# Patient Record
Sex: Female | Born: 1935
Health system: Southern US, Community
[De-identification: ages and names within clinical notes are randomized; demographics above are authoritative.]

## PROBLEM LIST (undated history)

## (undated) DIAGNOSIS — M810 Age-related osteoporosis without current pathological fracture: Secondary | ICD-10-CM

## (undated) DIAGNOSIS — G459 Transient cerebral ischemic attack, unspecified: Secondary | ICD-10-CM

## (undated) DIAGNOSIS — I10 Essential (primary) hypertension: Secondary | ICD-10-CM

## (undated) DIAGNOSIS — N390 Urinary tract infection, site not specified: Secondary | ICD-10-CM

## (undated) DIAGNOSIS — E785 Hyperlipidemia, unspecified: Secondary | ICD-10-CM

## (undated) DIAGNOSIS — G500 Trigeminal neuralgia: Secondary | ICD-10-CM

## (undated) HISTORY — DX: Age-related osteoporosis without current pathological fracture: M81.0

## (undated) HISTORY — DX: Urinary tract infection, site not specified: N39.0

## (undated) HISTORY — PX: TUBAL LIGATION: SHX77

## (undated) HISTORY — DX: Hyperlipidemia, unspecified: E78.5

## (undated) HISTORY — DX: Trigeminal neuralgia: G50.0

## (undated) HISTORY — DX: Essential (primary) hypertension: I10

## (undated) HISTORY — DX: Transient cerebral ischemic attack, unspecified: G45.9

---

## 2004-12-06 ENCOUNTER — Ambulatory Visit (HOSPITAL_COMMUNITY): Admission: RE | Admit: 2004-12-06 | Discharge: 2004-12-06 | Payer: Self-pay | Admitting: Family Medicine

## 2004-12-29 ENCOUNTER — Ambulatory Visit (HOSPITAL_COMMUNITY): Admission: RE | Admit: 2004-12-29 | Discharge: 2004-12-29 | Payer: Self-pay | Admitting: Gastroenterology

## 2009-07-22 ENCOUNTER — Inpatient Hospital Stay (HOSPITAL_COMMUNITY): Admission: EM | Admit: 2009-07-22 | Discharge: 2009-07-23 | Payer: Self-pay | Admitting: Emergency Medicine

## 2009-07-23 ENCOUNTER — Encounter (INDEPENDENT_AMBULATORY_CARE_PROVIDER_SITE_OTHER): Payer: Self-pay | Admitting: Emergency Medicine

## 2009-07-23 ENCOUNTER — Ambulatory Visit: Payer: Self-pay | Admitting: Vascular Surgery

## 2010-07-26 LAB — URINALYSIS, ROUTINE W REFLEX MICROSCOPIC
Bilirubin Urine: NEGATIVE
Hgb urine dipstick: NEGATIVE
Ketones, ur: NEGATIVE mg/dL
pH: 6 (ref 5.0–8.0)

## 2010-07-26 LAB — DIFFERENTIAL
Eosinophils Absolute: 0.2 10*3/uL (ref 0.0–0.7)
Eosinophils Relative: 3 % (ref 0–5)
Monocytes Absolute: 0.4 10*3/uL (ref 0.1–1.0)
Monocytes Relative: 5 % (ref 3–12)
Neutro Abs: 4.7 10*3/uL (ref 1.7–7.7)

## 2010-07-26 LAB — GLUCOSE, CAPILLARY: Glucose-Capillary: 119 mg/dL — ABNORMAL HIGH (ref 70–99)

## 2010-07-26 LAB — POCT CARDIAC MARKERS: CKMB, poc: 1.1 ng/mL (ref 1.0–8.0)

## 2010-07-26 LAB — BASIC METABOLIC PANEL
BUN: 13 mg/dL (ref 6–23)
CO2: 28 mEq/L (ref 19–32)
Chloride: 104 mEq/L (ref 96–112)
GFR calc non Af Amer: 59 mL/min — ABNORMAL LOW (ref >60)
Glucose, Bld: 68 mg/dL — ABNORMAL LOW (ref 70–99)
Potassium: 4 mEq/L (ref 3.5–5.1)

## 2010-07-26 LAB — POCT I-STAT, CHEM 8
Calcium, Ion: 1.19 mmol/L (ref 1.12–1.32)
Glucose, Bld: 131 mg/dL — ABNORMAL HIGH (ref 70–99)
HCT: 50 % — ABNORMAL HIGH (ref 36.0–46.0)
Hemoglobin: 17 g/dL — ABNORMAL HIGH (ref 12.0–15.0)
Sodium: 136 mEq/L (ref 135–145)

## 2010-07-26 LAB — CARDIAC PANEL(CRET KIN+CKTOT+MB+TROPI)
CK, MB: 1 ng/mL (ref 0.3–4.0)
CK, MB: 1.7 ng/mL (ref 0.3–4.0)
Relative Index: 0.4 (ref 0.0–2.5)
Total CK: 173 U/L (ref 7–177)

## 2010-07-26 LAB — CK TOTAL AND CKMB (NOT AT ARMC): CK, MB: 1.8 ng/mL (ref 0.3–4.0)

## 2010-07-26 LAB — CBC
HCT: 45.8 % (ref 36.0–46.0)
Platelets: 357 10*3/uL (ref 150–400)
WBC: 8 10*3/uL (ref 4.0–10.5)

## 2010-09-17 NOTE — Op Note (Signed)
[name], Valerie Lopez            ACCOUNT NO.:  [id number]   MEDICAL RECORD NO.:  [id number]          PATIENT TYPE:  AMB   LOCATION:  ENDO                         FACILITY:  [facility]   PHYSICIAN:  John C. Madilyn Fireman, M.D.    DATE OF BIRTH:  10-02-1934   DATE OF PROCEDURE:  12/29/2004  DATE OF DISCHARGE:                                 OPERATIVE REPORT   PROCEDURE:  Esophagogastroduodenoscopy with esophageal dilatation.   INDICATIONS FOR PROCEDURE:  Dysphagia primarily from pill, barium swallow  showing cervical web at C5-6, obstructing the barium tablet.   DESCRIPTION OF PROCEDURE:  The patient was placed in the left lateral  decubitus position and placed on the pulse monitor with continuous low-flow  oxygen delivered by nasal cannula. She was sedated with 75 mcg IV fentanyl  and 7.5 mg IV Versed. The Olympus video endoscope was advanced under direct  vision into the oropharynx and esophagus. The esophagus was straight and of  normal caliber with the squamocolumnar line at 38 cm.  I could not clearly  ____________ cervical web, although careful visualization at about 20 cm did  reveal some friability after the scope had been passed, but there was no  perceived resistance to passage of the scope through this area. I do not see  any definite mucosal lesions. The stomach was entered and a small amount of  liquid secretions suctioned from the fundus. Retroflexed view of the cardia  was unremarkable. The fundus, body and pylorus all appeared normal. The  duodenum was entered and both the bulb and second portion were well  inspected and appeared to be within normal limits. A Savary guidewire was  placed through the endoscope channel and scope withdrawn. Savary dilators of  12.8 and 14 mm were passed with moderate resistance on the second dilator  and some blood seen on withdrawal. The second dilator was removed together  with the wire and the patient returned to the recovery room in stable  condition. She tolerated the procedure well and there were no immediate  complications.   IMPRESSION:  Presumed cervical ring based on symptoms and barium swallow  status post dilatation to 14 mm.   PLAN:  Advance diet and observe response to dilatation. If symptoms  persistent may need repeat dilatation with a larger diameter dilator.           ______________________________  Everardo All. Madilyn Fireman, M.D.     [facility]MED[facility]  D:  12/29/2004  T:  12/29/2004  Job:  [id number]   cc:   Holley Bouche, M.D.  [address]  [place], [place] [address]  Fax: [phone]

## 2011-06-28 ENCOUNTER — Other Ambulatory Visit: Payer: Self-pay | Admitting: Gastroenterology

## 2011-06-29 ENCOUNTER — Other Ambulatory Visit: Payer: Self-pay

## 2011-07-05 ENCOUNTER — Ambulatory Visit
Admission: RE | Admit: 2011-07-05 | Discharge: 2011-07-05 | Disposition: A | Discharge status: Home / Self Care | Payer: Medicare HMO | Source: Ambulatory Visit | Attending: Gastroenterology | Admitting: Gastroenterology

## 2011-08-14 ENCOUNTER — Ambulatory Visit (INDEPENDENT_AMBULATORY_CARE_PROVIDER_SITE_OTHER): Payer: Medicare HMO | Admitting: Family Medicine

## 2011-08-14 ENCOUNTER — Ambulatory Visit: Payer: Medicare HMO

## 2011-08-14 VITALS — BP 162/75 | HR 71 | Temp 98.2°F | Resp 16 | Ht 64.0 in | Wt 130.0 lb

## 2011-08-14 DIAGNOSIS — R519 Headache, unspecified: Secondary | ICD-10-CM

## 2011-08-14 DIAGNOSIS — R51 Headache: Secondary | ICD-10-CM

## 2011-08-14 DIAGNOSIS — G501 Atypical facial pain: Secondary | ICD-10-CM

## 2011-08-14 LAB — POCT CBC
HCT, POC: 44 % (ref 37.7–47.9)
Hemoglobin: 14.5 g/dL (ref 12.2–16.2)
Lymph, poc: 3.1 (ref 0.6–3.4)
MCV: 89.7 fL (ref 80–97)
MID (cbc): 0.7 (ref 0–0.9)
POC Granulocyte: 5 (ref 2–6.9)
POC LYMPH PERCENT: 35.7 %L (ref 10–50)
POC MID %: 7.9 %M (ref 0–12)
RDW, POC: 14.6 %
WBC: 8.8 10*3/uL (ref 4.6–10.2)

## 2011-08-14 MED ORDER — ACETAMINOPHEN-CODEINE #3 300-30 MG PO TABS: 1.0000 | ORAL_TABLET | Freq: Three times a day (TID) | ORAL | Status: AC | PRN

## 2011-08-14 MED ORDER — GABAPENTIN 100 MG PO CAPS: ORAL_CAPSULE | ORAL | Status: DC

## 2011-08-14 NOTE — Progress Notes (Signed)
  Subjective:    Patient ID: Valerie Lopez, female    DOB: 08/14/1934, 77 y.o.   MRN: [id number]  HPI  Complains of (L) facial and dental pain intermittent over the last 9-12 months Feels tender "root or gum" and radiates over (L) facial region No pain with mastication (L) side of nose stuffy and watery (L) eye  Pain can be quite intense; typically lasting 30 minutes however yesterday lasted 3 hours  Dental visit last fall panorex normal Primary MD - Dr. Cliffton Asters   Review of Systems     Objective:   Physical Exam  Constitutional: She is oriented to person, place, and time. She appears well-developed.  HENT:  Right Ear: Hearing and tympanic membrane normal.  Left Ear: Hearing and tympanic membrane normal.  Nose: Nose normal.  Mouth/Throat: No dental abscesses or dental caries.    Cardiovascular: Normal rate, regular rhythm and normal heart sounds.   Pulmonary/Chest: Effort normal and breath sounds normal.  Neurological: She is alert and oriented to person, place, and time. She has normal reflexes. No cranial nerve deficit or sensory deficit. She exhibits normal muscle tone.  Skin: Skin is warm.      [facility] reading (PRIMARY) by  Dr. Hal Hope Normal sinus series  Results for orders placed in visit on 08/14/11  POCT CBC      Component Value Range   WBC 8.8  4.6 - 10.2 (K/uL)   Lymph, poc 3.1  0.6 - 3.4    POC LYMPH PERCENT 35.7  10 - 50 (%L)   MID (cbc) 0.7  0 - 0.9    POC MID % 7.9  0 - 12 (%M)   POC Granulocyte 5.0  2 - 6.9    Granulocyte percent 56.4  37 - 80 (%G)   RBC 4.90  4.04 - 5.48 (M/uL)   Hemoglobin 14.5  12.2 - 16.2 (g/dL)   HCT, POC 44.0  10.2 - 47.9 (%)   MCV 89.7  80 - 97 (fL)   MCH, POC 29.6  27 - 31.2 (pg)   MCHC 33.0  31.8 - 35.4 (g/dL)   RDW, POC 72.5     Platelet Count, POC 534 (*) 142 - 424 (K/uL)   MPV 9.0  0 - 99.8 (fL)         Assessment & Plan:   1. Trigeminal neuralgia  POCT CBC, DG Sinuses Complete, gabapentin (NEURONTIN) 100 MG  capsule, acetaminophen-codeine (TYLENOL #3) 300-30 MG per tablet   Patient to schedule follow up with her primary MD

## 2011-08-17 ENCOUNTER — Telehealth: Payer: Self-pay

## 2011-08-17 NOTE — Telephone Encounter (Signed)
Pt wants to know if it okay NOT to increase her medications to two a day.  She has taken one tablet and does not feel she needs to increase the dosage per Dr. Lenord Fellers instruction. Please call  Okay to leave message on machine  [phone]

## 2011-08-18 NOTE — Telephone Encounter (Signed)
Patient diagnosed with trigeminal neuralgia which is a very painful condition.  If patient's symptoms are controlled by taking 1 tablet daily patient does not need to titrate up on dosage.

## 2011-08-18 NOTE — Telephone Encounter (Signed)
Please advise on this.  

## 2014-08-28 ENCOUNTER — Telehealth: Payer: Self-pay | Admitting: Internal Medicine

## 2014-08-28 NOTE — Telephone Encounter (Signed)
new patient referral-s/w patient and gave np appt for 05/25 @ 10:30 w/Dr. Julien Nordmann Referring dr. Harlan Stains Dx- Thrombocytosis

## 2014-09-24 ENCOUNTER — Ambulatory Visit (HOSPITAL_BASED_OUTPATIENT_CLINIC_OR_DEPARTMENT_OTHER): Payer: Medicare HMO | Admitting: Internal Medicine

## 2014-09-24 ENCOUNTER — Ambulatory Visit: Payer: Medicare HMO

## 2014-09-24 ENCOUNTER — Encounter: Payer: Self-pay | Admitting: Internal Medicine

## 2014-09-24 ENCOUNTER — Ambulatory Visit (HOSPITAL_BASED_OUTPATIENT_CLINIC_OR_DEPARTMENT_OTHER): Payer: Medicare HMO

## 2014-09-24 ENCOUNTER — Other Ambulatory Visit (HOSPITAL_BASED_OUTPATIENT_CLINIC_OR_DEPARTMENT_OTHER): Payer: Medicare HMO

## 2014-09-24 ENCOUNTER — Telehealth: Payer: Self-pay | Admitting: Internal Medicine

## 2014-09-24 ENCOUNTER — Other Ambulatory Visit: Payer: Self-pay | Admitting: Internal Medicine

## 2014-09-24 VITALS — BP 160/63 | HR 70 | Temp 98.3°F | Resp 18 | Ht 64.0 in | Wt 131.4 lb

## 2014-09-24 DIAGNOSIS — D75839 Thrombocytosis, unspecified: Secondary | ICD-10-CM

## 2014-09-24 DIAGNOSIS — D473 Essential (hemorrhagic) thrombocythemia: Secondary | ICD-10-CM

## 2014-09-24 LAB — COMPREHENSIVE METABOLIC PANEL (CC13)
ALBUMIN: 3.6 g/dL (ref 3.5–5.0)
ALT: 9 U/L (ref 0–55)
AST: 20 U/L (ref 5–34)
Alkaline Phosphatase: 68 U/L (ref 40–150)
Anion Gap: 10 mEq/L (ref 3–11)
BILIRUBIN TOTAL: 0.46 mg/dL (ref 0.20–1.20)
BUN: 11.9 mg/dL (ref 7.0–26.0)
CO2: 29 mEq/L (ref 22–29)
CREATININE: 0.9 mg/dL (ref 0.6–1.1)
Calcium: 8.8 mg/dL (ref 8.4–10.4)
Chloride: 100 mEq/L (ref 98–109)
EGFR: 61 mL/min/{1.73_m2} — ABNORMAL LOW (ref >90)
Glucose: 77 mg/dl (ref 70–140)
Potassium: 3.9 mEq/L (ref 3.5–5.1)
Sodium: 138 mEq/L (ref 136–145)
TOTAL PROTEIN: 7.1 g/dL (ref 6.4–8.3)

## 2014-09-24 LAB — CBC WITH DIFFERENTIAL/PLATELET
BASO%: 2.4 % — AB (ref 0.0–2.0)
BASOS ABS: 0.2 10*3/uL — AB (ref 0.0–0.1)
EOS%: 3.5 % (ref 0.0–7.0)
Eosinophils Absolute: 0.2 10*3/uL (ref 0.0–0.5)
HCT: 43.4 % (ref 34.8–46.6)
HEMOGLOBIN: 14.2 g/dL (ref 11.6–15.9)
LYMPH%: 28 % (ref 14.0–49.7)
MCH: 29.7 pg (ref 25.1–34.0)
MCHC: 32.8 g/dL (ref 31.5–36.0)
MCV: 90.6 fL (ref 79.5–101.0)
MONO#: 0.5 10*3/uL (ref 0.1–0.9)
MONO%: 7.7 % (ref 0.0–14.0)
NEUT#: 4.1 10*3/uL (ref 1.5–6.5)
NEUT%: 58.4 % (ref 38.4–76.8)
Platelets: 531 10*3/uL — ABNORMAL HIGH (ref 145–400)
RBC: 4.79 10*6/uL (ref 3.70–5.45)
RDW: 14.3 % (ref 11.2–14.5)
WBC: 7 10*3/uL (ref 3.9–10.3)
lymph#: 2 10*3/uL (ref 0.9–3.3)

## 2014-09-24 LAB — FERRITIN CHCC: Ferritin: 95 ng/ml (ref 9–269)

## 2014-09-24 LAB — IRON AND TIBC CHCC
%SAT: 37 % (ref 21–57)
IRON: 116 ug/dL (ref 41–142)
TIBC: 310 ug/dL (ref 236–444)
UIBC: 194 ug/dL (ref 120–384)

## 2014-09-24 LAB — LACTATE DEHYDROGENASE (CC13): LDH: 202 U/L (ref 125–245)

## 2014-09-24 NOTE — Progress Notes (Signed)
[facility] [phone]   [phone]  CONSULT NOTE  REFERRING PHYSICIAN:Dr. Harlan Stains  REASON FOR CONSULTATION:  80 years old white female with elevated platelets count  HPI Valerie Lopez is a 80 y.o. female with past medical history significant for hypertension, dyslipidemia, TIA as well as osteoporosis. The patient was seen recently by her primary care physician Dr. Dema Severin and during routine blood work on 06/26/2014 she was noted to have elevated platelets count of 522,000. The patient was referred to me for evaluation and recommendation regarding this abnormality. The patient otherwise feeling fine with no specific issues. She has no personal or family history ofblood disease. She denied having any bleeding, bruises or ecchymosis. The patient denied having any rheumatologic disorders or recent viral infection. She denied having any history of anemia and she is not currently on any iron or vitamin supplements. Review of systems today showed that the patient has no complaints except for around 10 pounds of weight loss recently. She has no night sweats. She denied having any significant fever or chills, no nausea or vomiting. The patient denied having any significant chest pain, shortness of breath, cough or hemoptysis. Family history significant for mother with heart disease. Father died from old age and she had 3 brothers with different type of cancer including 2 with throat cancer and one with lung cancer. The patient is a widow and has 6 children. She is currently retired and used to do clerical work. She has no history of smoking, alcohol or drug abuse. HPI  Past Medical History  Diagnosis Date  . Hypertension   . UTI (lower urinary tract infection)   . Dyslipidemia   . TIA (transient ischemic attack)   . Trigeminal neuralgia   . Osteoporosis     History reviewed. No pertinent past surgical history.  Family History  Problem Relation  Age of Onset  . Heart disease Mother   . Cancer Brother     Social History History  Substance Use Topics  . Smoking status: Never Smoker   . Smokeless tobacco: Never Used  . Alcohol Use: No    Allergies  Allergen Reactions  . Zocor [Simvastatin] Other (See Comments)    Elevated liver enzymes    Current Outpatient Prescriptions  Medication Sig Dispense Refill  . aspirin 81 MG tablet Take 81 mg by mouth daily.    . cholecalciferol (VITAMIN D) 1000 UNITS tablet Take 2,000 Units by mouth daily.    Marland Kitchen ezetimibe (ZETIA) 10 MG tablet Take 10 mg by mouth daily.    Marland Kitchen lisinopril-hydrochlorothiazide (PRINZIDE,ZESTORETIC) 10-12.5 MG per tablet Take 1 tablet by mouth daily.    . Multiple Vitamin (MULTIVITAMIN) capsule Take 1 capsule by mouth daily.     No current facility-administered medications for this visit.    Review of Systems  Constitutional: negative Eyes: negative Ears, nose, mouth, throat, and face: negative Respiratory: negative Cardiovascular: negative Gastrointestinal: negative Genitourinary:negative Integument/breast: negative Hematologic/lymphatic: negative Musculoskeletal:negative Neurological: negative Behavioral/Psych: negative Endocrine: negative Allergic/Immunologic: negative  Physical Exam  QVZ:DGLOV, healthy, no distress, well nourished and well developed SKIN: skin color, texture, turgor are normal, no rashes or significant lesions HEAD: Normocephalic, No masses, lesions, tenderness or abnormalities EYES: normal, PERRLA EARS: External ears normal, Canals clear OROPHARYNX:no exudate, no erythema and lips, buccal mucosa, and tongue normal  NECK: supple, no adenopathy, no JVD LYMPH:  no palpable lymphadenopathy, no hepatosplenomegaly BREAST:not examined LUNGS: clear to auscultation , and palpation HEART: regular rate & rhythm  and no murmurs ABDOMEN:abdomen soft, non-tender, normal bowel sounds and no masses or organomegaly BACK: Back symmetric, no  curvature., No CVA tenderness EXTREMITIES:no joint deformities, effusion, or inflammation, no edema, no skin discoloration  NEURO: alert & oriented x 3 with fluent speech, no focal motor/sensory deficits  PERFORMANCE STATUS: ECOG 0  LABORATORY DATA: Lab Results  Component Value Date   WBC 7.0 09/24/2014   HGB 14.2 09/24/2014   HCT 43.4 09/24/2014   MCV 90.6 09/24/2014   PLT 531* 09/24/2014      Chemistry      Component Value Date/Time   NA 138 09/24/2014 1042   NA 137 07/22/2009 1703   K 3.9 09/24/2014 1042   K 4.0 07/22/2009 1703   CL 104 07/22/2009 1703   CO2 29 09/24/2014 1042   CO2 28 07/22/2009 1703   BUN 11.9 09/24/2014 1042   BUN 13 07/22/2009 1703   CREATININE 0.9 09/24/2014 1042   CREATININE 0.94 07/22/2009 1703      Component Value Date/Time   CALCIUM 8.8 09/24/2014 1042   CALCIUM 8.9 07/22/2009 1703   ALKPHOS 68 09/24/2014 1042   AST 20 09/24/2014 1042   ALT 9 09/24/2014 1042   BILITOT 0.46 09/24/2014 1042       RADIOGRAPHIC STUDIES: No results found.  ASSESSMENT: This is a very pleasant 80 years old white female presented for evaluation of thrombocytosis questionable to be reactive in nature but I cannot rule out myeloproliferative disorder at this point.   PLAN:I had a lengthy discussion with the patient today about her condition and further investigation to confirm her diagnosis. I ordered several studies today including repeat CBC, comprehensive metabolic panel, LDH as well as iron study and ferritin. I also recommended for the patient to have monoclonal study for JAK 2 mutation to rule out the possibility of essential thrombocythemia. I advised the patient to continue on her daily aspirin. I would see her back for follow-up visit in a few weeks for evaluation and discussion of her pending lab results and further recommendation regarding her condition. The patient was advised to call immediately if she has any concerning symptoms in the  interval.  The patient voices understanding of current disease status and treatment options and is in agreement with the current care plan.  All questions were answered. The patient knows to call the clinic with any problems, questions or concerns. We can certainly see the patient much sooner if necessary.  Thank you so much for allowing me to participate in the care of Valerie Lopez. I will continue to follow up the patient with you and assist in her care.  I spent 35 minutes counseling the patient face to face. The total time spent in the appointment was 55 minutes.  Disclaimer: This note was dictated with voice recognition software. Similar sounding words can inadvertently be transcribed and may not be corrected upon review.   Khanh Tanori K. Sep 24, 2014, 7:11 PM

## 2014-09-24 NOTE — Telephone Encounter (Signed)
lvm for pt regarding to June appt....mailed pt appt sched/avs and letter °

## 2014-09-24 NOTE — Progress Notes (Signed)
Checked in new pt with no financial concerns. °

## 2014-10-02 ENCOUNTER — Telehealth: Payer: Self-pay | Admitting: Internal Medicine

## 2014-10-02 NOTE — Telephone Encounter (Signed)
returned call and confirmed appt..... °

## 2014-10-08 ENCOUNTER — Ambulatory Visit (INDEPENDENT_AMBULATORY_CARE_PROVIDER_SITE_OTHER): Payer: Medicare HMO

## 2014-10-08 ENCOUNTER — Ambulatory Visit (INDEPENDENT_AMBULATORY_CARE_PROVIDER_SITE_OTHER): Payer: Medicare HMO | Admitting: Family Medicine

## 2014-10-08 VITALS — BP 122/70 | HR 68 | Temp 98.3°F | Resp 18 | Ht 64.25 in | Wt 130.2 lb

## 2014-10-08 DIAGNOSIS — R319 Hematuria, unspecified: Secondary | ICD-10-CM

## 2014-10-08 DIAGNOSIS — R1084 Generalized abdominal pain: Secondary | ICD-10-CM

## 2014-10-08 LAB — POCT UA - MICROSCOPIC ONLY
CASTS, UR, LPF, POC: NEGATIVE
Crystals, Ur, HPF, POC: NEGATIVE
Yeast, UA: NEGATIVE

## 2014-10-08 LAB — POCT URINALYSIS DIPSTICK
BILIRUBIN UA: NEGATIVE
GLUCOSE UA: NEGATIVE
Ketones, UA: 15
Leukocytes, UA: NEGATIVE
Nitrite, UA: NEGATIVE
PH UA: 5.5
SPEC GRAV UA: 1.025
UROBILINOGEN UA: 0.2

## 2014-10-08 LAB — POCT CBC
GRANULOCYTE PERCENT: 79.3 % (ref 37–80)
HCT, POC: 44.3 % (ref 37.7–47.9)
Hemoglobin: 14.5 g/dL (ref 12.2–16.2)
Lymph, poc: 1.4 (ref 0.6–3.4)
MCH: 29.2 pg (ref 27–31.2)
MCHC: 32.7 g/dL (ref 31.8–35.4)
MCV: 89.4 fL (ref 80–97)
MID (cbc): 0.7 (ref 0–0.9)
MPV: 6.9 fL (ref 0–99.8)
POC Granulocyte: 8.2 — AB (ref 2–6.9)
POC LYMPH PERCENT: 14 %L (ref 10–50)
POC MID %: 6.7 % (ref 0–12)
Platelet Count, POC: 555 10*3/uL — AB (ref 142–424)
RBC: 4.95 M/uL (ref 4.04–5.48)
RDW, POC: 14.6 %
WBC: 10.3 10*3/uL — AB (ref 4.6–10.2)

## 2014-10-08 MED ORDER — ACETAMINOPHEN-CODEINE #3 300-30 MG PO TABS: 1.0000 | ORAL_TABLET | ORAL | Status: DC | PRN

## 2014-10-08 MED ORDER — ONDANSETRON 4 MG PO TBDP: 4.0000 mg | ORAL_TABLET | Freq: Three times a day (TID) | ORAL | Status: DC | PRN

## 2014-10-08 MED ORDER — ONDANSETRON 4 MG PO TBDP
4.0000 mg | ORAL_TABLET | Freq: Once | ORAL | Status: AC
  Administered 2014-10-08: 4 mg via ORAL

## 2014-10-08 NOTE — Progress Notes (Deleted)
Subjective:   This chart was scribed for Dr. Delman Cheadle, MD by Erling Conte, ED Scribe. This patient was seen in Room 5 and the patient's care was started at 5:12 PM.    Patient ID: Valerie Lopez, female    DOB: [birth year], 80 y.o.   MRN: [id number]  HPI  HPI Comments: Valerie Lopez is a 80 y.o. female who presents to the [facility] complaining of constant, generalized, abdominal pain for 1 day. She states the pain radiates to her lower back. Pt has had associated nausea and 1 episode of vomiting. She reports when she took [place] this morning and had some Ginger Ale and immediately threw up after. She denies nay other episodes of vomiting. She denies any hematemesis. She states nothing makes the pain worse. She has had a bowel movement today.  She denies any h/o indigestion or heartburn. She denies any new medication intake. She denies h/o abdominal surgeries. Her last colonoscopy was 1 year ago and she states the results were unremarkable. She denies any fever, chills, vaginal pain, vaginal discharge or urinary symptoms.    Patient Active Problem List   Diagnosis Date Noted   Thrombocytosis 09/24/2014   Past Medical History  Diagnosis Date   Hypertension    UTI (lower urinary tract infection)    Dyslipidemia    TIA (transient ischemic attack)    Trigeminal neuralgia    Osteoporosis    Past Surgical History  Procedure Laterality Date   Tubal ligation     Allergies  Allergen Reactions   Zocor [Simvastatin] Other (See Comments)    Elevated liver enzymes   Prior to Admission medications   Medication Sig Start Date End Date Taking? Authorizing Provider  aspirin 81 MG tablet Take 81 mg by mouth daily.   Yes Historical Provider, MD  cholecalciferol (VITAMIN D) 1000 UNITS tablet Take 2,000 Units by mouth daily.   Yes Historical Provider, MD  ezetimibe (ZETIA) 10 MG tablet Take 10 mg by mouth daily.   Yes Historical Provider, MD    lisinopril-hydrochlorothiazide (PRINZIDE,ZESTORETIC) 10-12.5 MG per tablet Take 1 tablet by mouth daily.   Yes Historical Provider, MD  Multiple Vitamin (MULTIVITAMIN) capsule Take 1 capsule by mouth daily.   Yes Historical Provider, MD   History   Social History   Marital Status: Widowed    Spouse Name: N/A   Number of Children: N/A   Years of Education: N/A   Occupational History   Not on file.   Social History Main Topics   Smoking status: Never Smoker    Smokeless tobacco: Never Used   Alcohol Use: No   Drug Use: No   Sexual Activity: Not Currently   Other Topics Concern   Not on file   Social History Narrative    Review of Systems  Constitutional: Negative for fever and chills.  Gastrointestinal: Positive for nausea, vomiting (1x) and abdominal pain. Negative for diarrhea, constipation and blood in stool.  Genitourinary: Negative for dysuria, urgency, frequency, hematuria, flank pain, vaginal bleeding, vaginal discharge, difficulty urinating and vaginal pain.  Musculoskeletal: Positive for back pain (radiates from abdomen).     Objective:   Physical Exam  Constitutional: She is oriented to person, place, and time. She appears well-developed and well-nourished. No distress.  HENT:  Head: Normocephalic and atraumatic.  Eyes: Conjunctivae and EOM are normal.  Neck: Neck supple. No tracheal deviation present.  Cardiovascular: Normal rate, S1 normal, S2 normal and normal heart sounds.  No murmur heard. Pulmonary/Chest: Effort normal and breath sounds normal. No respiratory distress. She has no wheezes. She has no rhonchi. She has no rales.  Abdominal: She exhibits no mass. Bowel sounds are decreased. There is no hepatosplenomegaly. There is generalized tenderness. There is no rebound, no guarding and no CVA tenderness.  tympanitic bowel sounds  Musculoskeletal: Normal range of motion.  Neurological: She is alert and oriented to person, place, and time.   Skin: Skin is warm and dry.  Psychiatric: She has a normal mood and affect. Her behavior is normal.  Nursing note and vitals reviewed.  Triage Vitals: BP 122/70 mmHg   Pulse 68   Temp(Src) 98.3 F (36.8 C) (Oral)   Resp 18   Ht 5' 4.25" (1.632 m)   Wt 130 lb 3.2 oz (59.058 kg)   BMI 22.17 kg/m2   SpO2 97%    [facility] reading (PRIMARY) by  Dr. Brigitte Pulse. Abdominal xray:  Moderate stool burden, non-specific bowel gas pattern,  No free air, no air-fluid levels.  No gallstones or kidney stones seen.  Results for orders placed or performed in visit on 10/08/14  POCT CBC  Result Value Ref Range   WBC 10.3 (A) 4.6 - 10.2 K/uL   Lymph, poc 1.4 0.6 - 3.4   POC LYMPH PERCENT 14.0 10 - 50 %L   MID (cbc) 0.7 0 - 0.9   POC MID % 6.7 0 - 12 %M   POC Granulocyte 8.2 (A) 2 - 6.9   Granulocyte percent 79.3 37 - 80 %G   RBC 4.95 4.04 - 5.48 M/uL   Hemoglobin 14.5 12.2 - 16.2 g/dL   HCT, POC 44.3 37.7 - 47.9 %   MCV 89.4 80 - 97 fL   MCH, POC 29.2 27 - 31.2 pg   MCHC 32.7 31.8 - 35.4 g/dL   RDW, POC 14.6 %   Platelet Count, POC 555 (A) 142 - 424 K/uL   MPV 6.9 0 - 99.8 fL  POCT UA - Microscopic Only  Result Value Ref Range   WBC, Ur, HPF, POC 1-3    RBC, urine, microscopic 10-20    Bacteria, U Microscopic trace    Mucus, UA trace    Epithelial cells, urine per micros 2-4    Crystals, Ur, HPF, POC neg    Casts, Ur, LPF, POC neg    Yeast, UA neg   POCT urinalysis dipstick  Result Value Ref Range   Color, UA yellow    Clarity, UA clear    Glucose, UA neg    Bilirubin, UA neg    Ketones, UA 15    Spec Grav, UA 1.025    Blood, UA large    pH, UA 5.5    Protein, UA trace    Urobilinogen, UA 0.2    Nitrite, UA neg    Leukocytes, UA Negative     Assessment & Plan:

## 2014-10-08 NOTE — Patient Instructions (Signed)
Kidney Stones Kidney stones (urolithiasis) are deposits that form inside your kidneys. The intense pain is caused by the stone moving through the urinary tract. When the stone moves, the ureter goes into spasm around the stone. The stone is usually passed in the urine.  CAUSES   A disorder that makes certain neck glands produce too much parathyroid hormone (primary hyperparathyroidism).  A buildup of uric acid crystals, similar to gout in your joints.  Narrowing (stricture) of the ureter.  A kidney obstruction present at birth (congenital obstruction).  Previous surgery on the kidney or ureters.  Numerous kidney infections. SYMPTOMS   Feeling sick to your stomach (nauseous).  Throwing up (vomiting).  Blood in the urine (hematuria).  Pain that usually spreads (radiates) to the groin.  Frequency or urgency of urination. DIAGNOSIS   Taking a history and physical exam.  Blood or urine tests.  CT scan.  Occasionally, an examination of the inside of the urinary bladder (cystoscopy) is performed. TREATMENT   Observation.  Increasing your fluid intake.  Extracorporeal shock wave lithotripsy--This is a noninvasive procedure that uses shock waves to break up kidney stones.  Surgery may be needed if you have severe pain or persistent obstruction. There are various surgical procedures. Most of the procedures are performed with the use of small instruments. Only small incisions are needed to accommodate these instruments, so recovery time is minimized. The size, location, and chemical composition are all important variables that will determine the proper choice of action for you. Talk to your health care provider to better understand your situation so that you will minimize the risk of injury to yourself and your kidney.  HOME CARE INSTRUCTIONS   Drink enough water and fluids to keep your urine clear or pale yellow. This will help you to pass the stone or stone fragments.  Strain  all urine through the provided strainer. Keep all particulate matter and stones for your health care provider to see. The stone causing the pain may be as small as a grain of salt. It is very important to use the strainer each and every time you pass your urine. The collection of your stone will allow your health care provider to analyze it and verify that a stone has actually passed. The stone analysis will often identify what you can do to reduce the incidence of recurrences.  Only take over-the-counter or prescription medicines for pain, discomfort, or fever as directed by your health care provider.  Make a follow-up appointment with your health care provider as directed.  Get follow-up X-rays if required. The absence of pain does not always mean that the stone has passed. It may have only stopped moving. If the urine remains completely obstructed, it can cause loss of kidney function or even complete destruction of the kidney. It is your responsibility to make sure X-rays and follow-ups are completed. Ultrasounds of the kidney can show blockages and the status of the kidney. Ultrasounds are not associated with any radiation and can be performed easily in a matter of minutes. SEEK MEDICAL CARE IF:  You experience pain that is progressive and unresponsive to any pain medicine you have been prescribed. SEEK IMMEDIATE MEDICAL CARE IF:   Pain cannot be controlled with the prescribed medicine.  You have a fever or shaking chills.  The severity or intensity of pain increases over 18 hours and is not relieved by pain medicine.  You develop a new onset of abdominal pain.  You feel faint or pass out.  You are unable to urinate. MAKE SURE YOU:   Understand these instructions.  Will watch your condition.  Will get help right away if you are not doing well or get worse. Document Released: 04/18/2005 Document Revised: 12/19/2012 Document Reviewed: 09/19/2012 [facility] Patient Information 2015  [place], [place]. This information is not intended to replace advice given to you by your health care provider. Make sure you discuss any questions you have with your health care provider. Constipation Constipation is when a person has fewer than three bowel movements a week, has difficulty having a bowel movement, or has stools that are dry, hard, or larger than normal. As people grow older, constipation is more common. If you try to fix constipation with medicines that make you have a bowel movement (laxatives), the problem may get worse. Long-term laxative use may cause the muscles of the colon to become weak. A low-fiber diet, not taking in enough fluids, and taking certain medicines may make constipation worse.  CAUSES   Certain medicines, such as antidepressants, pain medicine, iron supplements, antacids, and water pills.   Certain diseases, such as diabetes, irritable bowel syndrome (IBS), thyroid disease, or depression.   Not drinking enough water.   Not eating enough fiber-rich foods.   Stress or travel.   Lack of physical activity or exercise.   Ignoring the urge to have a bowel movement.   Using laxatives too much.  SIGNS AND SYMPTOMS   Having fewer than three bowel movements a week.   Straining to have a bowel movement.   Having stools that are hard, dry, or larger than normal.   Feeling full or bloated.   Pain in the lower abdomen.   Not feeling relief after having a bowel movement.  DIAGNOSIS  Your health care provider will take a medical history and perform a physical exam. Further testing may be done for severe constipation. Some tests may include:  A barium enema X-ray to examine your rectum, colon, and, sometimes, your small intestine.   A sigmoidoscopy to examine your lower colon.   A colonoscopy to examine your entire colon. TREATMENT  Treatment will depend on the severity of your constipation and what is causing it. Some dietary treatments  include drinking more fluids and eating more fiber-rich foods. Lifestyle treatments may include regular exercise. If these diet and lifestyle recommendations do not help, your health care provider may recommend taking over-the-counter laxative medicines to help you have bowel movements. Prescription medicines may be prescribed if over-the-counter medicines do not work.  HOME CARE INSTRUCTIONS   Eat foods that have a lot of fiber, such as fruits, vegetables, whole grains, and beans.  Limit foods high in fat and processed sugars, such as french fries, hamburgers, cookies, candies, and soda.   A fiber supplement may be added to your diet if you cannot get enough fiber from foods.   Drink enough fluids to keep your urine clear or pale yellow.   Exercise regularly or as directed by your health care provider.   Go to the restroom when you have the urge to go. Do not hold it.   Only take over-the-counter or prescription medicines as directed by your health care provider. Do not take other medicines for constipation without talking to your health care provider first.  [place] IF:   You have bright red blood in your stool.   Your constipation lasts for more than 4 days or gets worse.   You have abdominal or rectal pain.  You have thin, pencil-like stools.   You have unexplained weight loss. MAKE SURE YOU:   Understand these instructions.  Will watch your condition.  Will get help right away if you are not doing well or get worse. Document Released: 01/15/2004 Document Revised: 04/23/2013 Document Reviewed: 01/28/2013 [facility] Patient Information 2015 [place], [place]. This information is not intended to replace advice given to you by your health care provider. Make sure you discuss any questions you have with your health care provider. Soft-Food Meal Plan A soft-food meal plan includes foods that are safe and easy to swallow. This meal plan typically is  used:  If you are having trouble chewing or swallowing foods.  As a transition meal plan after only having had liquid meals for a long period. WHAT DO I NEED TO KNOW ABOUT THE SOFT-FOOD MEAL PLAN? A soft-food meal plan includes tender foods that are soft and easy to chew and swallow. In most cases, bite-sized pieces of food are easier to swallow. A bite-sized piece is about  inch or smaller. Foods in this plan do not need to be ground or pureed. Foods that are very hard, crunchy, or sticky should be avoided. Also, breads, cereals, yogurts, and desserts with nuts, seeds, or fruits should be avoided. WHAT FOODS CAN I EAT? Grains Rice and wild rice. Moist bread, dressing, pasta, and noodles. Well-moistened dry or cooked cereals, such as farina (cooked wheat cereal), oatmeal, or grits. Biscuits, breads, muffins, pancakes, and waffles that have been well moistened. Vegetables Shredded lettuce. Cooked, tender vegetables, including potatoes without skins. Vegetable juices. Broths or creamed soups made with vegetables that are not stringy or chewy. Strained tomatoes (without seeds). Fruits Canned or well-cooked fruits. Soft (ripe), peeled fresh fruits, such as peaches, nectarines, kiwi, cantaloupe, honeydew melon, and watermelon (without seeds). Soft berries with small seeds, such as strawberries. Fruit juices (without pulp). Meats and Other Protein Sources Moist, tender, lean beef. Mutton. Lamb. Veal. Chicken. [place]. Liver. Ham. Fish without bones. Eggs. Dairy Milk, milk drinks, and cream. Plain cream cheese and cottage cheese. Plain yogurt. Sweets/Desserts Flavored gelatin desserts. Custard. Plain ice cream, frozen yogurt, sherbet, milk shakes, and malts. Plain cakes and cookies. Plain hard candy.  Other Butter, margarine (without trans fat), and cooking oils. Mayonnaise. Cream sauces. Mild spices, salt, and sugar. Syrup, molasses, honey, and jelly. The items listed above may not be a complete  list of recommended foods or beverages. Contact your dietitian for more options. WHAT FOODS ARE NOT RECOMMENDED? Grains Dry bread, toast, crackers that have not been moistened. Coarse or dry cereals, such as bran, granola, and shredded wheat. Tough or chewy crusty breads, such as [place] bread or baguettes. Vegetables Corn. Raw vegetables except shredded lettuce. Cooked vegetables that are tough or stringy. Tough, crisp, fried potatoes and potato skins. Fruits Fresh fruits with skins or seeds or both, such as apples, pears, or grapes. Stringy, high-pulp fruits, such as papaya, pineapple, coconut, or mango. Fruit leather, fruit roll-ups, and all dried fruits. Meats and Other Protein Sources Sausages and hot dogs. Meats with gristle. Fish with bones. Nuts, seeds, and chunky peanut or other nut butters. Sweets/Desserts Cakes or cookies that are very dry or chewy.  The items listed above may not be a complete list of foods and beverages to avoid. Contact your dietitian for more information. Document Released: 07/26/2007 Document Revised: 04/23/2013 Document Reviewed: 03/15/2013 [facility] Patient Information 2015 [place], [place]. This information is not intended to replace advice given to you by your health care  provider. Make sure you discuss any questions you have with your health care provider.

## 2014-10-09 ENCOUNTER — Telehealth: Payer: Self-pay

## 2014-10-09 ENCOUNTER — Inpatient Hospital Stay (HOSPITAL_COMMUNITY)
Admission: EM | Admit: 2014-10-09 | Discharge: 2014-10-13 | DRG: 418 | Disposition: A | Discharge status: Home / Self Care | Payer: Medicare HMO | Attending: Internal Medicine | Admitting: Internal Medicine

## 2014-10-09 ENCOUNTER — Encounter (HOSPITAL_COMMUNITY): Payer: Self-pay | Admitting: Emergency Medicine

## 2014-10-09 ENCOUNTER — Emergency Department (HOSPITAL_COMMUNITY): Payer: Medicare HMO

## 2014-10-09 ENCOUNTER — Ambulatory Visit (INDEPENDENT_AMBULATORY_CARE_PROVIDER_SITE_OTHER): Payer: Medicare HMO | Admitting: Family Medicine

## 2014-10-09 VITALS — BP 136/72 | HR 63 | Temp 98.3°F | Resp 16 | Ht 64.5 in | Wt 130.2 lb

## 2014-10-09 DIAGNOSIS — M810 Age-related osteoporosis without current pathological fracture: Secondary | ICD-10-CM | POA: Diagnosis present

## 2014-10-09 DIAGNOSIS — Z79899 Other long term (current) drug therapy: Secondary | ICD-10-CM | POA: Diagnosis not present

## 2014-10-09 DIAGNOSIS — G500 Trigeminal neuralgia: Secondary | ICD-10-CM | POA: Diagnosis present

## 2014-10-09 DIAGNOSIS — Z87440 Personal history of urinary (tract) infections: Secondary | ICD-10-CM

## 2014-10-09 DIAGNOSIS — K8000 Calculus of gallbladder with acute cholecystitis without obstruction: Secondary | ICD-10-CM | POA: Diagnosis present

## 2014-10-09 DIAGNOSIS — I10 Essential (primary) hypertension: Secondary | ICD-10-CM | POA: Diagnosis present

## 2014-10-09 DIAGNOSIS — K819 Cholecystitis, unspecified: Secondary | ICD-10-CM | POA: Diagnosis present

## 2014-10-09 DIAGNOSIS — Z888 Allergy status to other drugs, medicaments and biological substances status: Secondary | ICD-10-CM

## 2014-10-09 DIAGNOSIS — Z809 Family history of malignant neoplasm, unspecified: Secondary | ICD-10-CM | POA: Diagnosis not present

## 2014-10-09 DIAGNOSIS — R100 Acute abdomen: Secondary | ICD-10-CM | POA: Diagnosis not present

## 2014-10-09 DIAGNOSIS — Z8249 Family history of ischemic heart disease and other diseases of the circulatory system: Secondary | ICD-10-CM

## 2014-10-09 DIAGNOSIS — K8020 Calculus of gallbladder without cholecystitis without obstruction: Secondary | ICD-10-CM

## 2014-10-09 DIAGNOSIS — R1084 Generalized abdominal pain: Secondary | ICD-10-CM

## 2014-10-09 DIAGNOSIS — Z8673 Personal history of transient ischemic attack (TIA), and cerebral infarction without residual deficits: Secondary | ICD-10-CM

## 2014-10-09 DIAGNOSIS — E871 Hypo-osmolality and hyponatremia: Secondary | ICD-10-CM | POA: Diagnosis present

## 2014-10-09 DIAGNOSIS — K5900 Constipation, unspecified: Secondary | ICD-10-CM | POA: Diagnosis present

## 2014-10-09 DIAGNOSIS — E785 Hyperlipidemia, unspecified: Secondary | ICD-10-CM | POA: Diagnosis present

## 2014-10-09 DIAGNOSIS — R319 Hematuria, unspecified: Secondary | ICD-10-CM | POA: Diagnosis present

## 2014-10-09 DIAGNOSIS — E876 Hypokalemia: Secondary | ICD-10-CM | POA: Diagnosis present

## 2014-10-09 DIAGNOSIS — Z7982 Long term (current) use of aspirin: Secondary | ICD-10-CM | POA: Diagnosis not present

## 2014-10-09 DIAGNOSIS — R1010 Upper abdominal pain, unspecified: Secondary | ICD-10-CM | POA: Diagnosis not present

## 2014-10-09 DIAGNOSIS — R109 Unspecified abdominal pain: Secondary | ICD-10-CM | POA: Diagnosis not present

## 2014-10-09 DIAGNOSIS — R112 Nausea with vomiting, unspecified: Secondary | ICD-10-CM | POA: Diagnosis not present

## 2014-10-09 DIAGNOSIS — K8013 Calculus of gallbladder with acute and chronic cholecystitis with obstruction: Secondary | ICD-10-CM | POA: Diagnosis not present

## 2014-10-09 LAB — CBC WITH DIFFERENTIAL/PLATELET
BASOS ABS: 0 10*3/uL (ref 0.0–0.1)
Basophils Relative: 0 % (ref 0–1)
Eosinophils Absolute: 0 10*3/uL (ref 0.0–0.7)
Eosinophils Relative: 0 % (ref 0–5)
HEMATOCRIT: 44.2 % (ref 36.0–46.0)
Hemoglobin: 14.7 g/dL (ref 12.0–15.0)
LYMPHS PCT: 9 % — AB (ref 12–46)
Lymphs Abs: 1 10*3/uL (ref 0.7–4.0)
MCH: 30.1 pg (ref 26.0–34.0)
MCHC: 33.3 g/dL (ref 30.0–36.0)
MCV: 90.4 fL (ref 78.0–100.0)
Monocytes Absolute: 0.9 10*3/uL (ref 0.1–1.0)
Monocytes Relative: 9 % (ref 3–12)
Neutro Abs: 8.8 10*3/uL — ABNORMAL HIGH (ref 1.7–7.7)
Neutrophils Relative %: 82 % — ABNORMAL HIGH (ref 43–77)
Platelets: 499 10*3/uL — ABNORMAL HIGH (ref 150–400)
RBC: 4.89 MIL/uL (ref 3.87–5.11)
RDW: 13.5 % (ref 11.5–15.5)
WBC: 10.7 10*3/uL — ABNORMAL HIGH (ref 4.0–10.5)

## 2014-10-09 LAB — URINALYSIS, ROUTINE W REFLEX MICROSCOPIC
Bilirubin Urine: NEGATIVE
GLUCOSE, UA: NEGATIVE mg/dL
Ketones, ur: 15 mg/dL — AB
Leukocytes, UA: NEGATIVE
NITRITE: NEGATIVE
Protein, ur: 30 mg/dL — AB
Specific Gravity, Urine: 1.018 (ref 1.005–1.030)
Urobilinogen, UA: 1 mg/dL (ref 0.0–1.0)
pH: 6 (ref 5.0–8.0)

## 2014-10-09 LAB — POCT URINALYSIS DIPSTICK
GLUCOSE UA: NEGATIVE
KETONES UA: 40
Leukocytes, UA: NEGATIVE
NITRITE UA: NEGATIVE
Protein, UA: 100
UROBILINOGEN UA: 1
pH, UA: 6

## 2014-10-09 LAB — I-STAT TROPONIN, ED: Troponin i, poc: 0 ng/mL (ref 0.00–0.08)

## 2014-10-09 LAB — LIPASE, BLOOD: Lipase: 13 U/L — ABNORMAL LOW (ref 22–51)

## 2014-10-09 LAB — URINE MICROSCOPIC-ADD ON

## 2014-10-09 LAB — POCT CBC
Granulocyte percent: 86.5 %G — AB (ref 37–80)
HEMATOCRIT: 45.6 % (ref 37.7–47.9)
HEMOGLOBIN: 14.6 g/dL (ref 12.2–16.2)
Lymph, poc: 1 (ref 0.6–3.4)
MCH: 28.9 pg (ref 27–31.2)
MCHC: 32 g/dL (ref 31.8–35.4)
MCV: 90.2 fL (ref 80–97)
MID (CBC): 0.4 (ref 0–0.9)
MPV: 7.2 fL (ref 0–99.8)
POC GRANULOCYTE: 9.3 — AB (ref 2–6.9)
POC LYMPH %: 9.5 % — AB (ref 10–50)
POC MID %: 4 %M (ref 0–12)
Platelet Count, POC: 548 10*3/uL — AB (ref 142–424)
RBC: 5.05 M/uL (ref 4.04–5.48)
RDW, POC: 14.5 %
WBC: 10.7 10*3/uL — AB (ref 4.6–10.2)

## 2014-10-09 LAB — COMPREHENSIVE METABOLIC PANEL
ALT: 32 U/L (ref 14–54)
ANION GAP: 12 (ref 5–15)
AST: 43 U/L — ABNORMAL HIGH (ref 15–41)
Albumin: 3.9 g/dL (ref 3.5–5.0)
Alkaline Phosphatase: 68 U/L (ref 38–126)
BILIRUBIN TOTAL: 1.2 mg/dL (ref 0.3–1.2)
BUN: 13 mg/dL (ref 6–20)
CALCIUM: 9.2 mg/dL (ref 8.9–10.3)
CHLORIDE: 89 mmol/L — AB (ref 101–111)
CO2: 28 mmol/L (ref 22–32)
CREATININE: 0.8 mg/dL (ref 0.44–1.00)
Glucose, Bld: 110 mg/dL — ABNORMAL HIGH (ref 65–99)
Potassium: 3.4 mmol/L — ABNORMAL LOW (ref 3.5–5.1)
SODIUM: 129 mmol/L — AB (ref 135–145)
Total Protein: 7.7 g/dL (ref 6.5–8.1)

## 2014-10-09 LAB — POCT UA - MICROSCOPIC ONLY
CASTS, UR, LPF, POC: NEGATIVE
Crystals, Ur, HPF, POC: NEGATIVE
Mucus, UA: NEGATIVE
Yeast, UA: NEGATIVE

## 2014-10-09 LAB — MAGNESIUM: MAGNESIUM: 1.7 mg/dL (ref 1.7–2.4)

## 2014-10-09 MED ORDER — ONDANSETRON HCL 4 MG/2ML IJ SOLN
4.0000 mg | Freq: Once | INTRAMUSCULAR | Status: AC
  Administered 2014-10-09: 4 mg via INTRAMUSCULAR

## 2014-10-09 MED ORDER — MORPHINE SULFATE 2 MG/ML IJ SOLN
1.0000 mg | INTRAMUSCULAR | Status: DC | PRN
  Administered 2014-10-09: 1 mg via INTRAVENOUS
  Filled 2014-10-09: qty 1

## 2014-10-09 MED ORDER — ASPIRIN EC 81 MG PO TBEC
81.0000 mg | DELAYED_RELEASE_TABLET | Freq: Every day | ORAL | Status: DC
  Administered 2014-10-09: 81 mg via ORAL
  Filled 2014-10-09 (×2): qty 1

## 2014-10-09 MED ORDER — PIPERACILLIN-TAZOBACTAM 3.375 G IVPB
3.3750 g | Freq: Three times a day (TID) | INTRAVENOUS | Status: DC
  Administered 2014-10-09 – 2014-10-13 (×12): 3.375 g via INTRAVENOUS
  Filled 2014-10-09 (×13): qty 50

## 2014-10-09 MED ORDER — LISINOPRIL-HYDROCHLOROTHIAZIDE 20-12.5 MG PO TABS: 1.0000 | ORAL_TABLET | Freq: Every day | ORAL | Status: DC

## 2014-10-09 MED ORDER — SODIUM CHLORIDE 0.9 % IV SOLN
INTRAVENOUS | Status: DC
  Administered 2014-10-09: 18:00:00 via INTRAVENOUS

## 2014-10-09 MED ORDER — MORPHINE SULFATE 4 MG/ML IJ SOLN
4.0000 mg | Freq: Once | INTRAMUSCULAR | Status: AC
  Administered 2014-10-09: 4 mg via INTRAVENOUS
  Filled 2014-10-09: qty 1

## 2014-10-09 MED ORDER — ONDANSETRON HCL 4 MG/2ML IJ SOLN
4.0000 mg | Freq: Once | INTRAMUSCULAR | Status: AC
  Administered 2014-10-09: 4 mg via INTRAVENOUS
  Filled 2014-10-09: qty 2

## 2014-10-09 MED ORDER — LISINOPRIL 20 MG PO TABS
20.0000 mg | ORAL_TABLET | Freq: Every day | ORAL | Status: DC
  Administered 2014-10-09 – 2014-10-13 (×4): 20 mg via ORAL
  Filled 2014-10-09 (×5): qty 1

## 2014-10-09 MED ORDER — CETYLPYRIDINIUM CHLORIDE 0.05 % MT LIQD
7.0000 mL | Freq: Two times a day (BID) | OROMUCOSAL | Status: DC
  Administered 2014-10-09: 7 mL via OROMUCOSAL

## 2014-10-09 MED ORDER — HYDROCHLOROTHIAZIDE 12.5 MG PO CAPS
12.5000 mg | ORAL_CAPSULE | Freq: Every day | ORAL | Status: DC
  Administered 2014-10-09 – 2014-10-13 (×4): 12.5 mg via ORAL
  Filled 2014-10-09 (×5): qty 1

## 2014-10-09 MED ORDER — SODIUM CHLORIDE 0.9 % IV BOLUS (SEPSIS)
1000.0000 mL | Freq: Once | INTRAVENOUS | Status: AC
  Administered 2014-10-09: 1000 mL via INTRAVENOUS

## 2014-10-09 MED ORDER — MORPHINE SULFATE 4 MG/ML IJ SOLN
4.0000 mg | Freq: Once | INTRAMUSCULAR | Status: AC
Start: 2014-10-09 — End: 2014-10-09
  Administered 2014-10-09: 4 mg via INTRAVENOUS
  Filled 2014-10-09: qty 1

## 2014-10-09 MED ORDER — IOHEXOL 300 MG/ML  SOLN
50.0000 mL | Freq: Once | INTRAMUSCULAR | Status: AC | PRN
  Administered 2014-10-09: 50 mL via ORAL

## 2014-10-09 MED ORDER — IOHEXOL 300 MG/ML  SOLN
100.0000 mL | Freq: Once | INTRAMUSCULAR | Status: AC | PRN
  Administered 2014-10-09: 100 mL via INTRAVENOUS

## 2014-10-09 MED ORDER — CHLORHEXIDINE GLUCONATE 0.12 % MT SOLN
15.0000 mL | Freq: Two times a day (BID) | OROMUCOSAL | Status: DC
  Administered 2014-10-09 – 2014-10-10 (×2): 15 mL via OROMUCOSAL
  Filled 2014-10-09 (×4): qty 15

## 2014-10-09 MED ORDER — ENOXAPARIN SODIUM 40 MG/0.4ML ~~LOC~~ SOLN
40.0000 mg | SUBCUTANEOUS | Status: DC
  Administered 2014-10-09: 40 mg via SUBCUTANEOUS
  Filled 2014-10-09 (×2): qty 0.4

## 2014-10-09 MED ORDER — HYDROCODONE-ACETAMINOPHEN 5-325 MG PO TABS: 1.0000 | ORAL_TABLET | ORAL | Status: DC | PRN

## 2014-10-09 MED ORDER — ONDANSETRON HCL 4 MG/2ML IJ SOLN
4.0000 mg | Freq: Four times a day (QID) | INTRAMUSCULAR | Status: DC | PRN
  Administered 2014-10-09 – 2014-10-11 (×3): 4 mg via INTRAVENOUS
  Filled 2014-10-09 (×3): qty 2

## 2014-10-09 MED ORDER — ONDANSETRON 4 MG PO TBDP: 4.0000 mg | ORAL_TABLET | Freq: Once | ORAL | Status: DC

## 2014-10-09 MED ORDER — ONDANSETRON HCL 4 MG PO TABS: 4.0000 mg | ORAL_TABLET | Freq: Four times a day (QID) | ORAL | Status: DC | PRN

## 2014-10-09 NOTE — ED Notes (Signed)
Pt was sent here by Dr. Brigitte Pulse due to hematuria, possible kidney stone, blood count concerning for an infection and possible bowel obstruction.

## 2014-10-09 NOTE — ED Notes (Signed)
Nurse currently starting IV 

## 2014-10-09 NOTE — Telephone Encounter (Signed)
Per Physician, called pt's daughter Mikenzie and advised of pt's condition, pt is being sent to [facility] via private vehicle by her sister - in-law. Pt's daughter was advised that the pt was being referred to the ER for further evaluation for hematuria, possible partial small bowel obstruction and possible kidney stone. Pt's daughter was given [facility]'s number [phone] to contact Dr. Brigitte Pulse for further information regarding her mother.   Per physician, called Stacey/Charge Nurse - [facility] regarding pt being referred for further evaluation due to having hematuria, possible small bowel obstruction and possible kidney stone.

## 2014-10-09 NOTE — Consult Note (Signed)
Reason for Consult: abdominal pain and cholelithiasis  Referring Physician: Dr. Shirlyn Goltz   IAX:KPVVZ,SMOLMBE S, MD   Valerie Lopez is an 80 y.o. female.  HPI: Pt presented to Dr. Brigitte Pulse with abdominal pain 2 days ago,  She describes the pain as constant, nothing made it better or worse.  She then started having nausea and vomiting yesterday.  She got some pain medicine, and something for nausea that did not help  She is unable to eat or drink anything without more nausea and vomiting for the last 48hours. UA apparently showed some hematuria and she was sent to the Ed for evaluation to rule out SBO and nephrolithiasis.  She has not had a BM for 2 days.  Work up in the ED shows she is afebrile, VSS. NA is low, WBC 10.7, she does have hematuria.  CT scan shows GB distension, inflammation, with 2 large stones, some sludge,  some free fluid around the liver mild periportal edema within the liver. CBD 94mm.  She also has Bilateral adnexal cysts, a typical for age. The largest is on the left measuring 3.2 cm. Lipase is normal/low.  The AST is up to 43, other LFT's are normal.  We are ask to see.  Past Medical History  Diagnosis Date  Hypertension   Thrombycotisis, uncertain etiology   Hx of esophageal stricture with esophageal dilitation Dr. Teena Irani  2 years ago she thinks.  UTI (lower urinary tract infection)   Dyslipidemia   TIA (transient ischemic attack)   Trigeminal neuralgia   Osteoporosis     Past Surgical History  Procedure Laterality Date  . Tubal ligation      Family History  Problem Relation Age of Onset  . Heart disease Mother   . Cancer Brother     Social History:  reports that she has never smoked. She has never used smokeless tobacco. She reports that she does not drink alcohol or use illicit drugs.  Allergies:  Allergies  Allergen Reactions  . Zocor [Simvastatin] Other (See Comments)    Elevated liver enzymes    Prior to Admission medications   Medication  Sig Start Date End Date Taking? Authorizing Provider  aspirin EC 81 MG tablet Take 81 mg by mouth daily.   Yes Historical Provider, MD  ezetimibe (ZETIA) 10 MG tablet Take 10 mg by mouth daily.   Yes Historical Provider, MD  Javier Docker Oil 300 MG CAPS Take 1 capsule by mouth daily.   Yes Historical Provider, MD  lisinopril-hydrochlorothiazide (PRINZIDE,ZESTORETIC) 20-12.5 MG per tablet Take 1 tablet by mouth daily.   Yes Historical Provider, MD     Results for orders placed or performed during the hospital encounter of 10/09/14 (from the past 48 hour(s))  Urinalysis, Routine w reflex microscopic (not at [facility])     Status: Abnormal   Collection Time: 10/09/14 11:22 AM  Result Value Ref Range   Color, Urine YELLOW YELLOW   APPearance CLEAR CLEAR   Specific Gravity, Urine 1.018 1.005 - 1.030   pH 6.0 5.0 - 8.0   Glucose, UA NEGATIVE NEGATIVE mg/dL   Hgb urine dipstick MODERATE (A) NEGATIVE   Bilirubin Urine NEGATIVE NEGATIVE   Ketones, ur 15 (A) NEGATIVE mg/dL   Protein, ur 30 (A) NEGATIVE mg/dL   Urobilinogen, UA 1.0 0.0 - 1.0 mg/dL   Nitrite NEGATIVE NEGATIVE   Leukocytes, UA NEGATIVE NEGATIVE  Urine microscopic-add on     Status: None   Collection Time: 10/09/14 11:22 AM  Result Value  Ref Range   Squamous Epithelial / LPF RARE RARE   WBC, UA 0-2 <3 WBC/hpf   RBC / HPF 21-50 <3 RBC/hpf   Bacteria, UA RARE RARE   Urine-Other MUCOUS PRESENT   CBC with Differential     Status: Abnormal   Collection Time: 10/09/14 11:49 AM  Result Value Ref Range   WBC 10.7 (H) 4.0 - 10.5 K/uL   RBC 4.89 3.87 - 5.11 MIL/uL   Hemoglobin 14.7 12.0 - 15.0 g/dL   HCT 44.2 36.0 - 46.0 %   MCV 90.4 78.0 - 100.0 fL   MCH 30.1 26.0 - 34.0 pg   MCHC 33.3 30.0 - 36.0 g/dL   RDW 13.5 11.5 - 15.5 %   Platelets 499 (H) 150 - 400 K/uL   Neutrophils Relative % 82 (H) 43 - 77 %   Neutro Abs 8.8 (H) 1.7 - 7.7 K/uL   Lymphocytes Relative 9 (L) 12 - 46 %   Lymphs Abs 1.0 0.7 - 4.0 K/uL   Monocytes Relative 9 3 - 12  %   Monocytes Absolute 0.9 0.1 - 1.0 K/uL   Eosinophils Relative 0 0 - 5 %   Eosinophils Absolute 0.0 0.0 - 0.7 K/uL   Basophils Relative 0 0 - 1 %   Basophils Absolute 0.0 0.0 - 0.1 K/uL  Comprehensive metabolic panel     Status: Abnormal   Collection Time: 10/09/14 11:49 AM  Result Value Ref Range   Sodium 129 (L) 135 - 145 mmol/L   Potassium 3.4 (L) 3.5 - 5.1 mmol/L   Chloride 89 (L) 101 - 111 mmol/L   CO2 28 22 - 32 mmol/L   Glucose, Bld 110 (H) 65 - 99 mg/dL   BUN 13 6 - 20 mg/dL   Creatinine, Ser 0.80 0.44 - 1.00 mg/dL   Calcium 9.2 8.9 - 10.3 mg/dL   Total Protein 7.7 6.5 - 8.1 g/dL   Albumin 3.9 3.5 - 5.0 g/dL   AST 43 (H) 15 - 41 U/L   ALT 32 14 - 54 U/L   Alkaline Phosphatase 68 38 - 126 U/L   Total Bilirubin 1.2 0.3 - 1.2 mg/dL   GFR calc non Af Amer >60 >60 mL/min   GFR calc Af Amer >60 >60 mL/min    Comment: (NOTE) The eGFR has been calculated using the CKD EPI equation. This calculation has not been validated in all clinical situations. eGFR's persistently <60 mL/min signify possible Chronic Kidney Disease.    Anion gap 12 5 - 15  Lipase, blood     Status: Abnormal   Collection Time: 10/09/14 11:49 AM  Result Value Ref Range   Lipase 13 (L) 22 - 51 U/L  I-stat troponin, ED     Status: None   Collection Time: 10/09/14 11:55 AM  Result Value Ref Range   Troponin i, poc 0.00 0.00 - 0.08 ng/mL   Comment 3            Comment: Due to the release kinetics of cTnI, a negative result within the first hours of the onset of symptoms does not rule out myocardial infarction with certainty. If myocardial infarction is still suspected, repeat the test at appropriate intervals.     Ct Abdomen Pelvis W Contrast  10/09/2014   CLINICAL DATA:  Abdominal pain extending into the back. Nausea and vomiting. Microhematuria. Question small bowel obstruction.  EXAM: CT ABDOMEN AND PELVIS WITH CONTRAST  TECHNIQUE: Multidetector CT imaging of the abdomen and pelvis was  performed  using the standard protocol following bolus administration of intravenous contrast.  CONTRAST:  5mL OMNIPAQUE IOHEXOL 300 MG/ML SOLN, 119mL OMNIPAQUE IOHEXOL 300 MG/ML SOLN  COMPARISON:  Acute abdominal series 10/08/2014  FINDINGS: Moderate dependent airspace disease is present bilaterally. The heart size is normal.  A small amount of fluid can be seen just below the hemidiaphragm and surrounding the liver. Mild periportal edema is present in the liver. Spleen demonstrate scattered calcifications. No acute abnormality is present. The stomach is moderately distended. The duodenum and pancreas are within normal limits.  The common bile duct is enlarged, measuring up to 11 mm. At least 2 large gallstones are present. The larger stone measures up to 3.7 cm. Smaller stone measures 1.4 cm. Increased density material within the gallbladder likely represents sludge. There is some inflammatory change about the gallbladder.  The adrenal glands are normal bilaterally. The kidneys and ureters are within normal limits. The urinary bladder is unremarkable.  A small amount of free fluid within the anatomic pelvis is likely physiologic. Rectosigmoid colon is normal. The more proximal colon aids unremarkable. The appendix is visualized and within normal limits. The distal small bowel is mostly collapsed. The more proximal spot bowel is relatively collapsed as well. Atherosclerotic changes are noted in the distal aorta and branch vessels without aneurysm. Uterus is normal for age. Venous congestion is evident bilaterally. Cystic changes are noted in the adnexa bilaterally, measuring 2.6 by 2.3 cm on the right. A left-sided adnexal cyst measures 3.2 x 2.4 cm.  Bone windows demonstrate multilevel facet degenerative changes, left greater than right.  IMPRESSION: 1. Marked distention increased density of contents in the gallbladder with a least 2 large stones. This raises concern for cholecystitis. Right upper quadrant ultrasound could  be used for further evaluation if clinically indicated. 2. A small amount of free fluid is present about the liver and layering dependently within the pelvis, concerning for an inflammatory process. 3. Bilateral adnexal cysts, a typical for age. The largest is on the left measuring 3.2 cm. 4. Moderate dependent airspace bilaterally. While this may represent atelectasis, early infection is also considered. 5. Mild periportal edema within the liver.   Electronically Signed   By: San Morelle M.D.   On: 10/09/2014 14:06   Dg Abd Acute W/chest  10/08/2014   CLINICAL DATA:  80 year old female with complaint of generalized abdominal pain for 1 day with some radiation into the lower back.  EXAM: DG ABDOMEN ACUTE W/ 1V CHEST  COMPARISON:  Chest x-ray 07/22/2009.  FINDINGS: Lung volumes are normal. No consolidative airspace disease. No pleural effusions. No pneumothorax. No pulmonary nodule or mass noted. Pulmonary vasculature and the cardiomediastinal silhouette are within normal limits. Atherosclerosis in the thoracic aorta. Surgical clips in the left breast likely from prior lumpectomy.  Gas and stool are seen scattered throughout the colon extending to the level of the distal rectum. No pathologic distension of small bowel is noted. No gross evidence of pneumoperitoneum. Multiple pelvic phleboliths.  IMPRESSION: 1.  Nonobstructive bowel gas pattern. 2. No pneumoperitoneum. 3. Atherosclerosis. 4. No radiographic evidence of acute cardiopulmonary disease.   Electronically Signed   By: Vinnie Langton M.D.   On: 10/08/2014 20:05    Review of Systems  Constitutional: Negative for fever, chills, weight loss, malaise/fatigue and diaphoresis.  Eyes: Negative.   Respiratory: Negative.   Cardiovascular: Negative.   Gastrointestinal: Positive for nausea, vomiting and abdominal pain. Negative for heartburn, diarrhea, constipation, blood in stool and melena.  Genitourinary:  Negative for dysuria, urgency,  frequency, hematuria and flank pain.       She has had some odor in the past but not currently   Musculoskeletal: Negative.   Skin: Negative.   Neurological: Negative.  Negative for weakness.  Endo/Heme/Allergies: Negative.   Psychiatric/Behavioral: Negative.    Blood pressure 175/83, pulse 73, temperature 97.9 F (36.6 C), temperature source Oral, resp. rate 17, SpO2 95 %. Physical Exam  Constitutional: She is oriented to person, place, and time.  Thin female, uncomfortable, but no distress.  HENT:  Head: Normocephalic and atraumatic.  Nose: Nose normal.  Eyes: Conjunctivae and EOM are normal. Right eye exhibits no discharge. Left eye exhibits no discharge. No scleral icterus.  Neck: Neck supple. No JVD present. No tracheal deviation present. No thyromegaly present.  Cardiovascular: Normal rate, regular rhythm, normal heart sounds and intact distal pulses.   No murmur heard. Respiratory: Effort normal and breath sounds normal. No respiratory distress. She has no wheezes. She has no rales. She exhibits no tenderness.  GI: Soft. She exhibits no distension and no mass. There is tenderness (she has tenderness and discomfort all over, no one place localizes.  she has no peritonitis to go along with her complaints.). There is no rebound and no guarding.  Musculoskeletal: She exhibits no edema or tenderness.  Lymphadenopathy:    She has no cervical adenopathy.  Neurological: She is alert and oriented to person, place, and time. No cranial nerve deficit.  Skin: Skin is warm and dry. No rash noted. No erythema. No pallor.  Psychiatric: She has a normal mood and affect. Her behavior is normal. Judgment and thought content normal.    Assessment/Plan: Abdominal pain Cholelithiasis, normal LFT's Hx of esophageal stricture with dilatation. Hx of hypertension Hx of thrombocytosis Hx of TIA about 19 years ago Hx of UTI  Plan:  She has a mild diffuse abdominal tenderness. No one place seems  to be a source of discomfort for her.  Gallstones present, but no tenderness RUQ.  Medicine will admit, we will get a HIDA tomorrow.  Recheck labs in AM.  Earnstine Regal 10/09/2014, 2:29 PM

## 2014-10-09 NOTE — Progress Notes (Signed)
Subjective:  This chart was scribed for Delman Cheadle MD, by Tamsen Roers, at [facility].  This patient was seen in room 5 and the patient's care was started at 9:53 AM.    Patient ID: Valerie Lopez, female    DOB: Mar 23, 1935, 80 y.o.   MRN: [id number] Chief Complaint  Patient presents with  . Abdominal Pain    per pt still having pain across abd, follow up from 10/08/14  . vomit    this am, took pain medicine on empty stomach  . pt states when trying to eat she threw up  . pt states, she has not seen blood in urine    HPI  HPI Comments: Valerie Lopez is a 80 y.o. female who presents to the [facility] for a follow up after being seen here yesterday. Patient was complaining of abdominal pain which she states that she still has, vomiting (last episode this morning).She put the dissolvable nausea medication (4 am this morning) under her tongue and threw up less than a few minutes later.  She states that the pain medication she took last night made her throw up.   Patient also tried to eat oatmeal which caused her to vomit after a bite. Patient lives alone.  She has no other concerns today. Patients PCP is at [facility].   Past Medical History  Diagnosis Date  . Hypertension   . UTI (lower urinary tract infection)   . Dyslipidemia   . TIA (transient ischemic attack)   . Trigeminal neuralgia   . Osteoporosis     Current Outpatient Prescriptions on File Prior to Visit  Medication Sig Dispense Refill  . acetaminophen-codeine (TYLENOL #3) 300-30 MG per tablet Take 1 tablet by mouth every 4 (four) hours as needed for moderate pain. 30 tablet 0  . aspirin 81 MG tablet Take 81 mg by mouth daily.    . cholecalciferol (VITAMIN D) 1000 UNITS tablet Take 2,000 Units by mouth daily.    Marland Kitchen ezetimibe (ZETIA) 10 MG tablet Take 10 mg by mouth daily.    Marland Kitchen lisinopril-hydrochlorothiazide (PRINZIDE,ZESTORETIC) 10-12.5 MG per tablet Take 1 tablet by mouth  daily.    . Multiple Vitamin (MULTIVITAMIN) capsule Take 1 capsule by mouth daily.    . ondansetron (ZOFRAN ODT) 4 MG disintegrating tablet Take 1 tablet (4 mg total) by mouth every 8 (eight) hours as needed for nausea or vomiting. 20 tablet 0   No current facility-administered medications on file prior to visit.    Allergies  Allergen Reactions  . Zocor [Simvastatin] Other (See Comments)    Elevated liver enzymes    Review of Systems  Constitutional: Negative for fever and chills.  Eyes: Negative for pain, discharge and itching.  Respiratory: Negative for cough, choking and shortness of breath.   Gastrointestinal: Positive for nausea, vomiting and abdominal pain.       Objective:   Physical Exam  HENT:  Head: Normocephalic.  Neck: Normal range of motion.  Cardiovascular: Normal rate, regular rhythm and normal heart sounds.  Exam reveals no friction rub.   No murmur heard. Pulmonary/Chest: Effort normal and breath sounds normal. No respiratory distress. She has no wheezes. She has no rales. She exhibits no tenderness.  Abdominal: She exhibits no distension.  Hypoactive bowel sounds.  Hypoactive tympanitic bowel sounds Abdomen  Non- distended Mild generalized tenderness without rebound or guarding. No hepatosplenomegaly masses.    Neurological: She is alert.    Filed  Vitals:   10/09/14 0850  BP: 136/72  Pulse: 63  Temp: 98.3 F (36.8 C)  TempSrc: Oral  Resp: 16  Height: 5' 4.5" (1.638 m)  Weight: 130 lb 3.2 oz (59.058 kg)  SpO2: 94%    Results for orders placed or performed in visit on 10/09/14  POCT UA - Microscopic Only  Result Value Ref Range   WBC, Ur, HPF, POC 4-8    RBC, urine, microscopic 15-20    Bacteria, U Microscopic TRACE    Mucus, UA NEG    Epithelial cells, urine per micros 1-4    Crystals, Ur, HPF, POC NEG    Casts, Ur, LPF, POC NEG    Yeast, UA NEG   POCT urinalysis dipstick  Result Value Ref Range   Color, UA YELLOW    Clarity, UA CLEAR     Glucose, UA NEG    Bilirubin, UA SMALL    Ketones, UA 40    Spec Grav, UA >=1.030    Blood, UA LARGE    pH, UA 6.0    Protein, UA 100    Urobilinogen, UA 1.0    Nitrite, UA NEG    Leukocytes, UA Negative   POCT CBC  Result Value Ref Range   WBC 10.7 (A) 4.6 - 10.2 K/uL   Lymph, poc 1.0 0.6 - 3.4   POC LYMPH PERCENT 9.5 (A) 10 - 50 %L   MID (cbc) 0.4 0 - 0.9   POC MID % 4.0 0 - 12 %M   POC Granulocyte 9.3 (A) 2 - 6.9   Granulocyte percent 86.5 (A) 37 - 80 %G   RBC 5.05 4.04 - 5.48 M/uL   Hemoglobin 14.6 12.2 - 16.2 g/dL   HCT, POC 45.6 37.7 - 47.9 %   MCV 90.2 80 - 97 fL   MCH, POC 28.9 27 - 31.2 pg   MCHC 32.0 31.8 - 35.4 g/dL   RDW, POC 14.5 %   Platelet Count, POC 548 (A) 142 - 424 K/uL   MPV 7.2 0 - 99.8 fL       Assessment & Plan:   1. Generalized abdominal pain   2. Hematuria   3. Non-intractable vomiting with nausea, vomiting of unspecified type    To ER by private vehicle for CT scan and stat labs - labs from today and last night which where not POCT cancelled as pt will have done at ER again inevitably - UClx P.  [facility] charge nurse informed. Poss of nephrolithiasis due to hematuria but no h/o and i also suspect pt has a partial bowel obstruction - pt reminded only clear liquids until otherwise informed.  Orders Placed This Encounter  Procedures  . POCT UA - Microscopic Only  . POCT urinalysis dipstick  . POCT CBC    Meds ordered this encounter  Medications  . DISCONTD: ondansetron (ZOFRAN-ODT) disintegrating tablet 4 mg    Sig:   . ondansetron (ZOFRAN) injection 4 mg    Sig:     I personally performed the services described in this documentation, which was scribed in my presence. The recorded information has been reviewed and considered, and addended by me as needed.  Delman Cheadle, MD MPH

## 2014-10-09 NOTE — H&P (Signed)
[facility] History and Physical  Valerie Lopez EHU:[id number] DOB: Jan 29, 1935 DOA: 10/09/2014  Referring physician: Dr. Darl Lopez PCP: Valerie Schwalbe, MD   Chief Complaint: abd pain with N/V  HPI: Pt is 80 yo female fairly healthy at baseline who presented to [facility] ED with main concern of two days duration of progressively worsening upper quadrant abd pain, constant and throbbing, 7/10 in severity, occasionally but not consistently radiating to the lower abd quadrants, worse with eating and movement, no specific alleviating factors, associated with nausea and multiple episodes of non bloody vomiting, poor oral intake. Pt denies similar events in the past. Pt denies fevers, chills, chest pain or shortness of breath, no recent sick contacts or exposures. Pt has been seen in urgent care and was noted to have some hematuria, referred to [facility] for further evaluation.   In ED, pt noted to be hemodynamically stable, VSS, imaging studies notable for ? developing cholecystitis. Surgery consulted and [facility] asked to admit for further evaluation.   Assessment and Plan: Active Problems: Acute abd pain with N/V - CT abd with cholelithiasis, ? Developing cholecystitis - admit to medical bed, keep NPO for now - appreciate surgery team recommendations - HIDA scan requested for further evaluation - provide analgesia and antiemetics as needed  - place on empiric Zosyn for now  - repeat CMET in AM Hyponatremia - secondary to pre renal etiology - provide IVF and repeat BMP in AM Hypokalemia - supplement and repeat BMP in AM DVT prophylaxis  - Lovenox SQ  Radiological Exams on Admission: Ct Abdomen Pelvis W Contrast 10/09/2014   Marked distention increased density of contents in the gallbladder with a least 2 large stones. This raises concern for cholecystitis. Right upper quadrant ultrasound could be used for further evaluation if clinically indicated. 2. A small amount of free fluid is present about the  liver and layering dependently within the pelvis, concerning for an inflammatory process. 3. Bilateral adnexal cysts, a typical for age. The largest is on the left measuring 3.2 cm. 4. Moderate dependent airspace bilaterally. While this may represent atelectasis, early infection is also considered. 5. Mild periportal edema within the liver.     Dg Abd Acute W/chest 10/08/2014  Nonobstructive bowel gas pattern. 2. No pneumoperitoneum. 3. Atherosclerosis. 4. No radiographic evidence of acute cardiopulmonary disease.    Code Status: Full Family Communication: Pt at bedside Disposition Plan: Admit for further evaluation    Valerie Lopez [facility] [phone]   Review of Systems:  Constitutional: Negative for fever, chills and malaise/fatigue. Negative for diaphoresis.  HENT: Negative for hearing loss, ear pain, nosebleeds, congestion, sore throat, neck pain, tinnitus and ear discharge.   Eyes: Negative for blurred vision, double vision, photophobia, pain, discharge and redness.  Respiratory: Negative for cough, hemoptysis, sputum production, shortness of breath, wheezing and stridor.   Cardiovascular: Negative for chest pain, palpitations, orthopnea, claudication and leg swelling.  Gastrointestinal: Per HPI Genitourinary: Negative for dysuria, urgency, frequency, hematuria and flank pain.  Musculoskeletal: Negative for myalgias, back pain, joint pain and falls.  Skin: Negative for itching and rash.  Neurological: Negative for dizziness and weakness. .  Endo/Heme/Allergies: Negative for environmental allergies and polydipsia. Does not bruise/bleed easily.  Psychiatric/Behavioral: Negative for suicidal ideas. The patient is not nervous/anxious.      Past Medical History  Diagnosis Date  . Hypertension   . UTI (lower urinary tract infection)   . Dyslipidemia   . TIA (transient ischemic attack)   . Trigeminal neuralgia   .  Osteoporosis     Past Surgical History  Procedure Laterality Date  . Tubal  ligation      Social History:  reports that she has never smoked. She has never used smokeless tobacco. She reports that she does not drink alcohol or use illicit drugs.  Allergies  Allergen Reactions  . Zocor [Simvastatin] Other (See Comments)    Elevated liver enzymes    Family History  Problem Relation Age of Onset  . Heart disease Mother   . Cancer Brother     Prior to Admission medications   Medication Sig Start Date End Date Taking? Authorizing Provider  aspirin EC 81 MG tablet Take 81 mg by mouth daily.   Yes Historical Provider, MD  ezetimibe (ZETIA) 10 MG tablet Take 10 mg by mouth daily.   Yes Historical Provider, MD  Valerie Lopez Oil 300 MG CAPS Take 1 capsule by mouth daily.   Yes Historical Provider, MD  lisinopril-hydrochlorothiazide (PRINZIDE,ZESTORETIC) 20-12.5 MG per tablet Take 1 tablet by mouth daily.   Yes Historical Provider, MD    Physical Exam: Filed Vitals:   10/09/14 1245 10/09/14 1300 10/09/14 1500 10/09/14 1530  BP: 162/68 175/83 183/64 141/58  Pulse:  73 75 71  Temp:      TempSrc:      Resp: 18 17 17 13   SpO2:  95% 96% 94%    Physical Exam  Constitutional: Appears well-developed and well-nourished. No distress.  HENT: Normocephalic. External right and left ear normal. Oropharynx is clear and moist.  Eyes: Conjunctivae and EOM are normal. PERRLA, no scleral icterus.  Neck: Normal ROM. Neck supple. No JVD. No tracheal deviation. No thyromegaly.  CVS: RRR, S1/S2 +, no gallops, no carotid bruit.  Pulmonary: Effort and breath sounds normal, no stridor, rhonchi, wheezes, rales.  Abdominal: Diffuse abd tenderness, BS present, voluntary guarding  Musculoskeletal: Normal range of motion. No edema and no tenderness.  Lymphadenopathy: No lymphadenopathy noted, cervical, inguinal. Neuro: Alert. Normal reflexes, muscle tone coordination. No cranial nerve deficit. Skin: Skin is warm and dry. No rash noted. Not diaphoretic. No erythema. No pallor.  Psychiatric:  Normal mood and affect. Behavior, judgment, thought content normal.   Labs on Admission:  Basic Metabolic Panel:  Recent Labs Lab 10/09/14 1149  NA 129*  K 3.4*  CL 89*  CO2 28  GLUCOSE 110*  BUN 13  CREATININE 0.80  CALCIUM 9.2   Liver Function Tests:  Recent Labs Lab 10/09/14 1149  AST 43*  ALT 32  ALKPHOS 68  BILITOT 1.2  PROT 7.7  ALBUMIN 3.9    Recent Labs Lab 10/09/14 1149  LIPASE 13*   No results for input(s): AMMONIA in the last 168 hours. CBC:  Recent Labs Lab 10/08/14 1746 10/09/14 0949 10/09/14 1149  WBC 10.3* 10.7* 10.7*  NEUTROABS  --   --  8.8*  HGB 14.5 14.6 14.7  HCT 44.3 45.6 44.2  MCV 89.4 90.2 90.4  PLT  --   --  499*   EKG: pending    If 7PM-7AM, please contact night-coverage [email or web address] Password [email or web address] 10/09/2014, 4:01 PM

## 2014-10-09 NOTE — Patient Instructions (Signed)
i am concerned you could have a kidney stone or some other kidney problem due to the large persistent amount of blood in your urine.  Your blood counts are concerning for some type of infection.   Your bowels appear to not be pushing food through - that they are obstructed - this could be due to an infection causing an "ileus" where your bowels are not moving as well as normal due to inflammation but you need a CT scan so we can see if there is a reason why your bowels are becoming obstructed and you have blood in your urine.  You can drink clear liquids but no foods and go directly to [facility] ER - they are expecting you.  Intestinal Pseudo-Obstruction Intestinal false blockage is a condition that causes symptoms like those of a bowel blockage (obstruction). It is also called pseudo-obstruction. But when the intestines are examined, no obstruction is found. A problem in how the muscles and nerves in the intestines work causes the symptoms. SYMPTOMS   Cramps.  Stomach pain.  Nausea and vomiting.  Bloating.  Fewer bowel movements than usual.  Loose stools. Over time, pseudo-obstruction can cause:  Bacterial infections.  Malnutrition.  Muscle problems in other parts of the body.  Some people also have bladder problems. CAUSES   Diseases that affect muscles and nerves can cause similar symptoms. These diseases include:  Lupus erythematosus.  Scleroderma.  Parkinson's disease.  When a disease causes the symptoms, the condition is called secondary intestinal pseudo-obstruction.  Medications that affect muscles and nerves might also cause secondary pseudo-obstruction. Examples include:  Opiates.  Antidepressants. DIAGNOSIS  Your caregiver:  May take a complete medical history.  May do a physical exam.  May take X-rays. TREATMENT   Intravenous feeding (nutritional support). This will prevent malnutrition.  Antibiotics. These are used to treat bacterial  infections.  Medication. This might also be given to treat intestinal muscle problems.  In severe cases, surgery might be needed. It would be used to remove part of the intestine. Document Released: 02/13/2009 Document Revised: 07/11/2011 Document Reviewed: 07/24/2013 [facility] Patient Information 2015 [place], [place]. This information is not intended to replace advice given to you by your health care provider. Make sure you discuss any questions you have with your health care provider. Hematuria Hematuria is blood in your urine. It can be caused by a bladder infection, kidney infection, prostate infection, kidney stone, or cancer of your urinary tract. Infections can usually be treated with medicine, and a kidney stone usually will pass through your urine. If neither of these is the cause of your hematuria, further workup to find out the reason may be needed. It is very important that you tell your health care provider about any blood you see in your urine, even if the blood stops without treatment or happens without causing pain. Blood in your urine that happens and then stops and then happens again can be a symptom of a very serious condition. Also, pain is not a symptom in the initial stages of many urinary cancers. HOME CARE INSTRUCTIONS   Drink lots of fluid, 3-4 quarts a day. If you have been diagnosed with an infection, cranberry juice is especially recommended, in addition to large amounts of water.  Avoid caffeine, tea, and carbonated beverages because they tend to irritate the bladder.  Avoid alcohol because it may irritate the prostate.  Take all medicines as directed by your health care provider.  If you were prescribed an antibiotic medicine,  finish it all even if you start to feel better.  If you have been diagnosed with a kidney stone, follow your health care provider's instructions regarding straining your urine to catch the stone.  Empty your bladder often. Avoid holding  urine for long periods of time.  After a bowel movement, women should cleanse front to back. Use each tissue only once.  Empty your bladder before and after sexual intercourse if you are a female. SEEK MEDICAL CARE IF:  You develop back pain.  You have a fever.  You have a feeling of sickness in your stomach (nausea) or vomiting.  Your symptoms are not better in 3 days. Return sooner if you are getting worse. SEEK IMMEDIATE MEDICAL CARE IF:   You develop severe vomiting and are unable to keep the medicine down.  You develop severe back or abdominal pain despite taking your medicines.  You begin passing a large amount of blood or clots in your urine.  You feel extremely weak or faint, or you pass out. MAKE SURE YOU:   Understand these instructions.  Will watch your condition.  Will get help right away if you are not doing well or get worse. Document Released: 04/18/2005 Document Revised: 09/02/2013 Document Reviewed: 12/17/2012 [facility] Patient Information 2015 [place], [place]. This information is not intended to replace advice given to you by your health care provider. Make sure you discuss any questions you have with your health care provider. Clear Liquid Diet A clear liquid diet is a short-term diet that is prescribed to provide the necessary fluid and basic energy you need when you can have nothing else. The clear liquid diet consists of liquids or solids that will become liquid at room temperature. You should be able to see through the liquid. There are many reasons that you may be restricted to clear liquids, such as:  When you have a sudden-onset (acute) condition that occurs before or after surgery.  To help your body slowly get adjusted to food again after a long period when you were unable to have food.  Replacement of fluids when you have a diarrheal disease.  When you are going to have certain exams, such as a colonoscopy, in which instruments are inserted inside  your body to look at parts of your digestive system. WHAT CAN I HAVE? A clear liquid diet does not provide all the nutrients you need. It is important to choose a variety of the following items to get as many nutrients as possible:  Vegetable juices that do not have pulp.  Fruit juices and fruit drinks that do not have pulp.  Coffee (regular or decaffeinated), tea, or soda at the discretion of your health care provider.  Clear bouillon, broth, or strained broth-based soups.  High-protein and flavored gelatins.  Sugar or honey.  Ices or frozen ice pops that do not contain milk. If you are not sure whether you can have certain items, you should ask your health care provider. You may also ask your health care provider if there are any other clear liquid options. Document Released: 04/18/2005 Document Revised: 04/23/2013 Document Reviewed: 03/15/2013 [facility] Patient Information 2015 [place], [place]. This information is not intended to replace advice given to you by your health care provider. Make sure you discuss any questions you have with your health care provider.

## 2014-10-09 NOTE — ED Provider Notes (Signed)
CSN: [id number]     Arrival date & time 10/09/14  1113 History   First MD Initiated Contact with Patient 10/09/14 1115     Chief Complaint  Patient presents with  . Constipation  . Hematuria     (Consider location/radiation/quality/duration/timing/severity/associated sxs/prior Treatment) The history is provided by the patient.  Valerie Lopez is a 80 y.o. female hx of HTN, UTI, HL, TIA here with abdominal pain, hematuria. Patient has been having diffuse abdominal pain for the last 2-3 days. She was given Zofran yesterday with no relief. Went to see her primary care doctor today was noted to have some blood in her urine even though she doesn't notice any hematuria. She is sent in for possible small bowel obstruction and possible kidney stone. Had previous tubal ligations in the past but no other abdominal surgeries. States that her last bowel movement was 2 days ago and she is not passing much gas.   Past Medical History  Diagnosis Date  . Hypertension   . UTI (lower urinary tract infection)   . Dyslipidemia   . TIA (transient ischemic attack)   . Trigeminal neuralgia   . Osteoporosis    Past Surgical History  Procedure Laterality Date  . Tubal ligation     Family History  Problem Relation Age of Onset  . Heart disease Mother   . Cancer Brother    History  Substance Use Topics  . Smoking status: Never Smoker   . Smokeless tobacco: Never Used  . Alcohol Use: No   OB History    No data available     Review of Systems  Gastrointestinal: Positive for abdominal pain and constipation.  All other systems reviewed and are negative.     Allergies  Zocor  Home Medications   Prior to Admission medications   Medication Sig Start Date End Date Taking? Authorizing Provider  aspirin EC 81 MG tablet Take 81 mg by mouth daily.   Yes Historical Provider, MD  ezetimibe (ZETIA) 10 MG tablet Take 10 mg by mouth daily.   Yes Historical Provider, MD  Javier Docker Oil 300 MG CAPS Take 1  capsule by mouth daily.   Yes Historical Provider, MD  lisinopril-hydrochlorothiazide (PRINZIDE,ZESTORETIC) 20-12.5 MG per tablet Take 1 tablet by mouth daily.   Yes Historical Provider, MD   BP 175/83 mmHg  Pulse 73  Temp(Src) 97.9 F (36.6 C) (Oral)  Resp 17  SpO2 95% Physical Exam  Constitutional: She is oriented to person, place, and time.  Uncomfortable   HENT:  Head: Normocephalic.  MM slightly dry   Eyes: Conjunctivae are normal. Pupils are equal, round, and reactive to light.  Neck: Normal range of motion. Neck supple.  Cardiovascular: Normal rate, regular rhythm and normal heart sounds.   Pulmonary/Chest: Effort normal and breath sounds normal. No respiratory distress. She has no wheezes. She has no rales.  Abdominal: Soft. Bowel sounds are normal.  Distended, mild diffuse tenderness, no rebound. No CVAT   Musculoskeletal: Normal range of motion. She exhibits no edema or tenderness.  Neurological: She is alert and oriented to person, place, and time. No cranial nerve deficit. Coordination normal.  Skin: Skin is warm and dry.  Psychiatric: She has a normal mood and affect. Her behavior is normal. Judgment and thought content normal.  Nursing note and vitals reviewed.   ED Course  Procedures (including critical care time) Labs Review Labs Reviewed  CBC WITH DIFFERENTIAL/PLATELET - Abnormal; Notable for the following:    WBC 10.7 (*)  Platelets 499 (*)    Neutrophils Relative % 82 (*)    Neutro Abs 8.8 (*)    Lymphocytes Relative 9 (*)    All other components within normal limits  COMPREHENSIVE METABOLIC PANEL - Abnormal; Notable for the following:    Sodium 129 (*)    Potassium 3.4 (*)    Chloride 89 (*)    Glucose, Bld 110 (*)    AST 43 (*)    All other components within normal limits  LIPASE, BLOOD - Abnormal; Notable for the following:    Lipase 13 (*)    All other components within normal limits  URINALYSIS, ROUTINE W REFLEX MICROSCOPIC (NOT AT [facility]) -  Abnormal; Notable for the following:    Hgb urine dipstick MODERATE (*)    Ketones, ur 15 (*)    Protein, ur 30 (*)    All other components within normal limits  URINE MICROSCOPIC-ADD ON  I-STAT TROPOININ, ED    Imaging Review Ct Abdomen Pelvis W Contrast  10/09/2014   CLINICAL DATA:  Abdominal pain extending into the back. Nausea and vomiting. Microhematuria. Question small bowel obstruction.  EXAM: CT ABDOMEN AND PELVIS WITH CONTRAST  TECHNIQUE: Multidetector CT imaging of the abdomen and pelvis was performed using the standard protocol following bolus administration of intravenous contrast.  CONTRAST:  15mL OMNIPAQUE IOHEXOL 300 MG/ML SOLN, 119mL OMNIPAQUE IOHEXOL 300 MG/ML SOLN  COMPARISON:  Acute abdominal series 10/08/2014  FINDINGS: Moderate dependent airspace disease is present bilaterally. The heart size is normal.  A small amount of fluid can be seen just below the hemidiaphragm and surrounding the liver. Mild periportal edema is present in the liver. Spleen demonstrate scattered calcifications. No acute abnormality is present. The stomach is moderately distended. The duodenum and pancreas are within normal limits.  The common bile duct is enlarged, measuring up to 11 mm. At least 2 large gallstones are present. The larger stone measures up to 3.7 cm. Smaller stone measures 1.4 cm. Increased density material within the gallbladder likely represents sludge. There is some inflammatory change about the gallbladder.  The adrenal glands are normal bilaterally. The kidneys and ureters are within normal limits. The urinary bladder is unremarkable.  A small amount of free fluid within the anatomic pelvis is likely physiologic. Rectosigmoid colon is normal. The more proximal colon aids unremarkable. The appendix is visualized and within normal limits. The distal small bowel is mostly collapsed. The more proximal spot bowel is relatively collapsed as well. Atherosclerotic changes are noted in the distal  aorta and branch vessels without aneurysm. Uterus is normal for age. Venous congestion is evident bilaterally. Cystic changes are noted in the adnexa bilaterally, measuring 2.6 by 2.3 cm on the right. A left-sided adnexal cyst measures 3.2 x 2.4 cm.  Bone windows demonstrate multilevel facet degenerative changes, left greater than right.  IMPRESSION: 1. Marked distention increased density of contents in the gallbladder with a least 2 large stones. This raises concern for cholecystitis. Right upper quadrant ultrasound could be used for further evaluation if clinically indicated. 2. A small amount of free fluid is present about the liver and layering dependently within the pelvis, concerning for an inflammatory process. 3. Bilateral adnexal cysts, a typical for age. The largest is on the left measuring 3.2 cm. 4. Moderate dependent airspace bilaterally. While this may represent atelectasis, early infection is also considered. 5. Mild periportal edema within the liver.   Electronically Signed   By: San Morelle M.D.   On: 10/09/2014 14:06  Dg Abd Acute W/chest  10/08/2014   CLINICAL DATA:  80 year old female with complaint of generalized abdominal pain for 1 day with some radiation into the lower back.  EXAM: DG ABDOMEN ACUTE W/ 1V CHEST  COMPARISON:  Chest x-ray 07/22/2009.  FINDINGS: Lung volumes are normal. No consolidative airspace disease. No pleural effusions. No pneumothorax. No pulmonary nodule or mass noted. Pulmonary vasculature and the cardiomediastinal silhouette are within normal limits. Atherosclerosis in the thoracic aorta. Surgical clips in the left breast likely from prior lumpectomy.  Gas and stool are seen scattered throughout the colon extending to the level of the distal rectum. No pathologic distension of small bowel is noted. No gross evidence of pneumoperitoneum. Multiple pelvic phleboliths.  IMPRESSION: 1.  Nonobstructive bowel gas pattern. 2. No pneumoperitoneum. 3. Atherosclerosis.  4. No radiographic evidence of acute cardiopulmonary disease.   Electronically Signed   By: Vinnie Langton M.D.   On: 10/08/2014 20:05     EKG Interpretation   Date/Time:  Thursday October 09 2014 11:33:42 EDT Ventricular Rate:  70 PR Interval:  180 QRS Duration: 87 QT Interval:  423 QTC Calculation: 456 R Axis:   11 Text Interpretation:  Sinus rhythm Ventricular premature complex Probable  anteroseptal infarct, old No significant change since last tracing  Confirmed by Messiyah Waterson  MD, Eldor Conaway ([id number]) on 10/09/2014 11:53:38 AM      MDM   Final diagnoses:  Abdominal pain    YANNELY KINTZEL is a 80 y.o. female here with ab pain. Likely SBO vs gastro. Will get labs, UA, CT ab/pel.   2:46 PM CT showed large gallstones with free fluid. Consulted surgery, who recommend HIDA and medical admission. UA + blood with no UTI.     Wandra Arthurs, MD 10/09/14 [phone]

## 2014-10-09 NOTE — ED Notes (Signed)
Patient transported to CT 

## 2014-10-10 ENCOUNTER — Inpatient Hospital Stay (HOSPITAL_COMMUNITY): Payer: Medicare HMO | Admitting: Certified Registered Nurse Anesthetist

## 2014-10-10 ENCOUNTER — Encounter (HOSPITAL_COMMUNITY): Payer: Self-pay | Admitting: Anesthesiology

## 2014-10-10 ENCOUNTER — Inpatient Hospital Stay (HOSPITAL_COMMUNITY): Payer: Medicare HMO

## 2014-10-10 ENCOUNTER — Encounter (HOSPITAL_COMMUNITY)
Admission: EM | Disposition: A | Discharge status: Home / Self Care | Payer: Self-pay | Source: Home / Self Care | Attending: Internal Medicine

## 2014-10-10 DIAGNOSIS — K819 Cholecystitis, unspecified: Secondary | ICD-10-CM

## 2014-10-10 DIAGNOSIS — K8000 Calculus of gallbladder with acute cholecystitis without obstruction: Secondary | ICD-10-CM | POA: Diagnosis present

## 2014-10-10 HISTORY — PX: CHOLECYSTECTOMY: SHX55

## 2014-10-10 LAB — LIPASE, BLOOD: LIPASE: 11 U/L — AB (ref 22–51)

## 2014-10-10 LAB — URINALYSIS, ROUTINE W REFLEX MICROSCOPIC
Bilirubin Urine: NEGATIVE
GLUCOSE, UA: NEGATIVE mg/dL
Ketones, ur: NEGATIVE mg/dL
Leukocytes, UA: NEGATIVE
NITRITE: NEGATIVE
PROTEIN: NEGATIVE mg/dL
SPECIFIC GRAVITY, URINE: 1.017 (ref 1.005–1.030)
UROBILINOGEN UA: 0.2 mg/dL (ref 0.0–1.0)
pH: 6 (ref 5.0–8.0)

## 2014-10-10 LAB — CREATININE, SERUM
Creatinine, Ser: 0.8 mg/dL (ref 0.44–1.00)
GFR calc non Af Amer: >60 mL/min (ref >60)

## 2014-10-10 LAB — COMPREHENSIVE METABOLIC PANEL
ALK PHOS: 65 U/L (ref 38–126)
ALT: 28 U/L (ref 14–54)
AST: 39 U/L (ref 15–41)
Albumin: 3.7 g/dL (ref 3.5–5.0)
Anion gap: 12 (ref 5–15)
BILIRUBIN TOTAL: 1.5 mg/dL — AB (ref 0.3–1.2)
BUN: 11 mg/dL (ref 6–20)
CALCIUM: 8.8 mg/dL — AB (ref 8.9–10.3)
CO2: 27 mmol/L (ref 22–32)
Chloride: 90 mmol/L — ABNORMAL LOW (ref 101–111)
Creatinine, Ser: 0.93 mg/dL (ref 0.44–1.00)
GFR calc non Af Amer: 58 mL/min — ABNORMAL LOW (ref >60)
GLUCOSE: 93 mg/dL (ref 65–99)
Potassium: 2.8 mmol/L — ABNORMAL LOW (ref 3.5–5.1)
Sodium: 129 mmol/L — ABNORMAL LOW (ref 135–145)
TOTAL PROTEIN: 7.5 g/dL (ref 6.5–8.1)

## 2014-10-10 LAB — CBC
HCT: 46.7 % — ABNORMAL HIGH (ref 36.0–46.0)
HCT: 43.4 % (ref 36.0–46.0)
Hemoglobin: 15.5 g/dL — ABNORMAL HIGH (ref 12.0–15.0)
Hemoglobin: 14.5 g/dL (ref 12.0–15.0)
MCH: 30 pg (ref 26.0–34.0)
MCH: 30 pg (ref 26.0–34.0)
MCHC: 33.2 g/dL (ref 30.0–36.0)
MCHC: 33.4 g/dL (ref 30.0–36.0)
MCV: 90.5 fL (ref 78.0–100.0)
MCV: 89.9 fL (ref 78.0–100.0)
PLATELETS: 467 10*3/uL — AB (ref 150–400)
Platelets: 535 10*3/uL — ABNORMAL HIGH (ref 150–400)
RBC: 5.16 MIL/uL — AB (ref 3.87–5.11)
RBC: 4.83 MIL/uL (ref 3.87–5.11)
RDW: 13.4 % (ref 11.5–15.5)
RDW: 13.6 % (ref 11.5–15.5)
WBC: 10.5 10*3/uL (ref 4.0–10.5)
WBC: 7 10*3/uL (ref 4.0–10.5)

## 2014-10-10 LAB — URINE MICROSCOPIC-ADD ON

## 2014-10-10 LAB — SURGICAL PCR SCREEN
MRSA, PCR: NEGATIVE
Staphylococcus aureus: POSITIVE — AB

## 2014-10-10 LAB — URINE CULTURE

## 2014-10-10 SURGERY — LAPAROSCOPIC CHOLECYSTECTOMY WITH INTRAOPERATIVE CHOLANGIOGRAM
Anesthesia: General | Site: Abdomen

## 2014-10-10 MED ORDER — IOHEXOL 300 MG/ML  SOLN
INTRAMUSCULAR | Status: DC | PRN
  Administered 2014-10-10: 8 mL

## 2014-10-10 MED ORDER — LACTATED RINGERS IV SOLN
INTRAVENOUS | Status: DC
  Administered 2014-10-10: 1000 mL via INTRAVENOUS
  Administered 2014-10-10: 13:00:00 via INTRAVENOUS

## 2014-10-10 MED ORDER — HYDROMORPHONE HCL 1 MG/ML IJ SOLN: 1.0000 mg | INTRAMUSCULAR | Status: DC | PRN

## 2014-10-10 MED ORDER — METOCLOPRAMIDE HCL 5 MG/ML IJ SOLN
INTRAMUSCULAR | Status: DC | PRN
  Administered 2014-10-10: 10 mg via INTRAVENOUS

## 2014-10-10 MED ORDER — LIDOCAINE HCL (CARDIAC) 20 MG/ML IV SOLN
INTRAVENOUS | Status: DC | PRN
  Administered 2014-10-10: 50 mg via INTRAVENOUS

## 2014-10-10 MED ORDER — FENTANYL CITRATE (PF) 100 MCG/2ML IJ SOLN
INTRAMUSCULAR | Status: AC
  Filled 2014-10-10: qty 2

## 2014-10-10 MED ORDER — DEXAMETHASONE SODIUM PHOSPHATE 4 MG/ML IJ SOLN
INTRAMUSCULAR | Status: DC | PRN
  Administered 2014-10-10: 10 mg via INTRAVENOUS

## 2014-10-10 MED ORDER — PROPOFOL 10 MG/ML IV BOLUS
INTRAVENOUS | Status: DC | PRN
  Administered 2014-10-10: 120 mg via INTRAVENOUS

## 2014-10-10 MED ORDER — POTASSIUM CHLORIDE CRYS ER 20 MEQ PO TBCR
40.0000 meq | EXTENDED_RELEASE_TABLET | Freq: Two times a day (BID) | ORAL | Status: DC
  Administered 2014-10-10: 40 meq via ORAL
  Filled 2014-10-10: qty 2

## 2014-10-10 MED ORDER — LACTATED RINGERS IR SOLN
Status: DC | PRN
  Administered 2014-10-10: 1000 mL

## 2014-10-10 MED ORDER — ONDANSETRON HCL 4 MG/2ML IJ SOLN
INTRAMUSCULAR | Status: AC
  Filled 2014-10-10: qty 2

## 2014-10-10 MED ORDER — KCL IN DEXTROSE-NACL 20-5-0.45 MEQ/L-%-% IV SOLN
INTRAVENOUS | Status: DC
  Administered 2014-10-10 – 2014-10-12 (×3): via INTRAVENOUS
  Filled 2014-10-10 (×4): qty 1000

## 2014-10-10 MED ORDER — ONDANSETRON HCL 4 MG/2ML IJ SOLN
INTRAMUSCULAR | Status: DC | PRN
  Administered 2014-10-10: 4 mg via INTRAVENOUS

## 2014-10-10 MED ORDER — EPHEDRINE SULFATE 50 MG/ML IJ SOLN
INTRAMUSCULAR | Status: AC
  Filled 2014-10-10: qty 1

## 2014-10-10 MED ORDER — PHENYLEPHRINE HCL 10 MG/ML IJ SOLN
INTRAMUSCULAR | Status: DC | PRN
  Administered 2014-10-10: 80 ug via INTRAVENOUS
  Administered 2014-10-10: 120 ug via INTRAVENOUS

## 2014-10-10 MED ORDER — NEOSTIGMINE METHYLSULFATE 10 MG/10ML IV SOLN
INTRAVENOUS | Status: DC | PRN
  Administered 2014-10-10: 3 mg via INTRAVENOUS

## 2014-10-10 MED ORDER — CISATRACURIUM BESYLATE 20 MG/10ML IV SOLN
INTRAVENOUS | Status: AC
  Filled 2014-10-10: qty 10

## 2014-10-10 MED ORDER — GLYCOPYRROLATE 0.2 MG/ML IJ SOLN
INTRAMUSCULAR | Status: DC | PRN
Start: 2014-10-10 — End: 2014-10-10
  Administered 2014-10-10: 0.4 mg via INTRAVENOUS

## 2014-10-10 MED ORDER — BUPIVACAINE-EPINEPHRINE 0.5% -1:200000 IJ SOLN
INTRAMUSCULAR | Status: DC | PRN
  Administered 2014-10-10: 20 mL

## 2014-10-10 MED ORDER — PROPOFOL 10 MG/ML IV BOLUS
INTRAVENOUS | Status: AC
  Filled 2014-10-10: qty 20

## 2014-10-10 MED ORDER — ENOXAPARIN SODIUM 40 MG/0.4ML ~~LOC~~ SOLN
40.0000 mg | SUBCUTANEOUS | Status: DC
  Administered 2014-10-11 – 2014-10-12 (×2): 40 mg via SUBCUTANEOUS
  Filled 2014-10-10 (×3): qty 0.4

## 2014-10-10 MED ORDER — BUPIVACAINE-EPINEPHRINE (PF) 0.5% -1:200000 IJ SOLN
INTRAMUSCULAR | Status: AC
  Filled 2014-10-10: qty 30

## 2014-10-10 MED ORDER — TECHNETIUM TC 99M MEBROFENIN IV KIT
5.4000 | PACK | Freq: Once | INTRAVENOUS | Status: AC | PRN
  Administered 2014-10-10: 5.4 via INTRAVENOUS

## 2014-10-10 MED ORDER — LIDOCAINE HCL (CARDIAC) 20 MG/ML IV SOLN
INTRAVENOUS | Status: AC
  Filled 2014-10-10: qty 5

## 2014-10-10 MED ORDER — ACETAMINOPHEN 325 MG PO TABS: 650.0000 mg | ORAL_TABLET | ORAL | Status: DC | PRN

## 2014-10-10 MED ORDER — SUCCINYLCHOLINE CHLORIDE 20 MG/ML IJ SOLN
INTRAMUSCULAR | Status: DC | PRN
  Administered 2014-10-10: 100 mg via INTRAVENOUS

## 2014-10-10 MED ORDER — HYDROMORPHONE HCL 1 MG/ML IJ SOLN: 0.2500 mg | INTRAMUSCULAR | Status: DC | PRN

## 2014-10-10 MED ORDER — DEXAMETHASONE SODIUM PHOSPHATE 10 MG/ML IJ SOLN
INTRAMUSCULAR | Status: AC
  Filled 2014-10-10: qty 1

## 2014-10-10 MED ORDER — CISATRACURIUM BESYLATE (PF) 10 MG/5ML IV SOLN
INTRAVENOUS | Status: DC | PRN
  Administered 2014-10-10: 4 mg via INTRAVENOUS
  Administered 2014-10-10: 2 mg via INTRAVENOUS

## 2014-10-10 MED ORDER — EPHEDRINE SULFATE 50 MG/ML IJ SOLN
INTRAMUSCULAR | Status: DC | PRN
  Administered 2014-10-10: 10 mg via INTRAVENOUS

## 2014-10-10 MED ORDER — GLYCOPYRROLATE 0.2 MG/ML IJ SOLN
INTRAMUSCULAR | Status: AC
  Filled 2014-10-10: qty 3

## 2014-10-10 MED ORDER — HYDROCODONE-ACETAMINOPHEN 5-325 MG PO TABS: 1.0000 | ORAL_TABLET | ORAL | Status: DC | PRN

## 2014-10-10 MED ORDER — NEOSTIGMINE METHYLSULFATE 10 MG/10ML IV SOLN
INTRAVENOUS | Status: AC
  Filled 2014-10-10: qty 1

## 2014-10-10 MED ORDER — FENTANYL CITRATE (PF) 100 MCG/2ML IJ SOLN
INTRAMUSCULAR | Status: DC | PRN
  Administered 2014-10-10 (×4): 50 ug via INTRAVENOUS

## 2014-10-10 MED ORDER — PHENYLEPHRINE 40 MCG/ML (10ML) SYRINGE FOR IV PUSH (FOR BLOOD PRESSURE SUPPORT)
PREFILLED_SYRINGE | INTRAVENOUS | Status: AC
  Filled 2014-10-10: qty 10

## 2014-10-10 MED ORDER — MORPHINE SULFATE 4 MG/ML IJ SOLN
INTRAMUSCULAR | Status: AC
  Administered 2014-10-10: 2.4 mg
  Filled 2014-10-10: qty 1

## 2014-10-10 SURGICAL SUPPLY — 30 items
APPLIER CLIP ROT 10 11.4 M/L (STAPLE) ×2
BENZOIN TINCTURE PRP APPL 2/3 (GAUZE/BANDAGES/DRESSINGS) ×2 IMPLANT
CABLE HIGH FREQUENCY MONO STRZ (ELECTRODE) ×2 IMPLANT
CHLORAPREP W/TINT 26ML (MISCELLANEOUS) ×2 IMPLANT
CLIP APPLIE ROT 10 11.4 M/L (STAPLE) ×1 IMPLANT
COVER MAYO STAND STRL (DRAPES) ×2 IMPLANT
DECANTER SPIKE VIAL GLASS SM (MISCELLANEOUS) ×2 IMPLANT
DRAPE C-ARM 42X120 X-RAY (DRAPES) ×2 IMPLANT
DRAPE LAPAROSCOPIC ABDOMINAL (DRAPES) ×2 IMPLANT
ELECT REM PT RETURN 9FT ADLT (ELECTROSURGICAL) ×2
ELECTRODE REM PT RTRN 9FT ADLT (ELECTROSURGICAL) ×1 IMPLANT
GAUZE SPONGE 2X2 8PLY STRL LF (GAUZE/BANDAGES/DRESSINGS) ×1 IMPLANT
GLOVE SURG ORTHO 8.0 STRL STRW (GLOVE) ×2 IMPLANT
GOWN STRL REUS W/TWL XL LVL3 (GOWN DISPOSABLE) ×4 IMPLANT
HEMOSTAT SURGICEL 4X8 (HEMOSTASIS) IMPLANT
KIT BASIN OR (CUSTOM PROCEDURE TRAY) ×2 IMPLANT
POUCH SPECIMEN RETRIEVAL 10MM (ENDOMECHANICALS) ×2 IMPLANT
SCISSORS LAP 5X35 DISP (ENDOMECHANICALS) ×2 IMPLANT
SET CHOLANGIOGRAPH MIX (MISCELLANEOUS) ×2 IMPLANT
SET IRRIG TUBING LAPAROSCOPIC (IRRIGATION / IRRIGATOR) ×2 IMPLANT
SLEEVE XCEL OPT CAN 5 100 (ENDOMECHANICALS) ×2 IMPLANT
SPONGE GAUZE 2X2 STER 10/PKG (GAUZE/BANDAGES/DRESSINGS) ×1
STRIP CLOSURE SKIN 1/2X4 (GAUZE/BANDAGES/DRESSINGS) ×2 IMPLANT
SUT MNCRL AB 4-0 PS2 18 (SUTURE) ×2 IMPLANT
SUT VICRYL 0 UR6 27IN ABS (SUTURE) ×2 IMPLANT
TOWEL OR 17X26 10 PK STRL BLUE (TOWEL DISPOSABLE) ×2 IMPLANT
TRAY LAPAROSCOPIC (CUSTOM PROCEDURE TRAY) ×2 IMPLANT
TROCAR BLADELESS OPT 5 100 (ENDOMECHANICALS) ×2 IMPLANT
TROCAR XCEL BLUNT TIP 100MML (ENDOMECHANICALS) ×2 IMPLANT
TROCAR XCEL NON-BLD 11X100MML (ENDOMECHANICALS) ×2 IMPLANT

## 2014-10-10 NOTE — Transfer of Care (Signed)
Immediate Anesthesia Transfer of Care Note  Patient: Valerie Lopez  Procedure(s) Performed: Procedure(s): LAPAROSCOPIC CHOLECYSTECTOMY WITH INTRAOPERATIVE CHOLANGIOGRAM (N/A)  Patient Location: PACU  Anesthesia Type:General  Level of Consciousness: Patient easily awoken, sedated, comfortable, cooperative, following commands, responds to stimulation.   Airway & Oxygen Therapy: Patient spontaneously breathing, ventilating well, oxygen via simple oxygen mask.  Post-op Assessment: Report given to PACU RN, vital signs reviewed and stable, moving all extremities.   Post vital signs: Reviewed and stable.  Complications: No apparent anesthesia complications

## 2014-10-10 NOTE — Progress Notes (Signed)
Patient ID: Valerie Lopez, female   DOB: 10/01/1934, 80 y.o.   MRN: [id number]  [facility] PROGRESS NOTE  Valerie Lopez GMW:[id number] DOB: 11/21/1934 DOA: 10/09/2014 PCP: Vidal Schwalbe, MD   Brief narrative:    HPI: Pt is 81 yo female fairly healthy at baseline who presented to [facility] ED with main concern of two days duration of progressively worsening upper quadrant abd pain, constant and throbbing, 7/10 in severity, occasionally but not consistently radiating to the lower abd quadrants, worse with eating and movement, no specific alleviating factors, associated with nausea and multiple episodes of non bloody vomiting, poor oral intake. Pt denies similar events in the past. Pt denies fevers, chills, chest pain or shortness of breath, no recent sick contacts or exposures. Pt has been seen in urgent care and was noted to have some hematuria, referred to [facility] for further evaluation.   In ED, pt noted to be hemodynamically stable, VSS, imaging studies notable for ? developing cholecystitis. Surgery consulted and [facility] asked to admit for further evaluation.   Assessment/Plan:    Active Problems: Acute abd pain with N/V - CT abd with cholelithiasis, ? Developing cholecystitis - appreciate surgery team recommendations - HIDA scan done this AM, results pending  - provide analgesia and antiemetics as needed  - continue empiric Zosyn day #2, keep NPO for now  Hyponatremia - secondary to pre renal etiology - continue IVF and repeat BMP in AM Hypokalemia - still low, continue to supplement and try to keep K ~4 - repeat BMP in AM DVT prophylaxis  - Lovenox SQ  Code Status: Full.  Family Communication:  plan of care discussed with the patient Disposition Plan: Home when stable.   IV access:  Peripheral IV  Procedures and diagnostic studies:    Ct Abdomen Pelvis W Contrast 10/09/2014  Marked distention increased density of contents in the gallbladder with a least 2 large stones.  This raises concern for cholecystitis. Right upper quadrant ultrasound could be used for further evaluation if clinically indicated. 2. A small amount of free fluid is present about the liver and layering dependently within the pelvis, concerning for an inflammatory process. 3. Bilateral adnexal cysts, a typical for age. The largest is on the left measuring 3.2 cm. 4. Moderate dependent airspace bilaterally. While this may represent atelectasis, early infection is also considered. 5. Mild periportal edema within the liver.     Dg Abd Acute W/chest 10/08/2014   Nonobstructive bowel gas pattern. 2. No pneumoperitoneum. 3. Atherosclerosis. 4. No radiographic evidence of acute cardiopulmonary disease.     Medical Consultants:  Surgery   Other Consultants:  None  IAnti-Infectives:   Zosyn 6/10 -->  Faye Ramsay, MD  [facility] Pager [phone]  If 7PM-7AM, please contact night-coverage [email or web address] Password [email or web address] 10/10/2014, 10:10 AM   LOS: 1 day   HPI/Subjective: No events overnight.   Objective: Filed Vitals:   10/09/14 1613 10/09/14 1716 10/09/14 2121 10/10/14 0455  BP: 146/62  130/49 138/64  Pulse: 72  65 74  Temp: 98 F (36.7 C)  98.1 F (36.7 C) 98.3 F (36.8 C)  TempSrc: Oral  Oral Oral  Resp: 18  18 16   Height:  5' 5.5" (1.664 m)    Weight:  59.6 kg (131 lb 6.3 oz)    SpO2: 95%  97% 99%    Intake/Output Summary (Last 24 hours) at 10/10/14 1010 Last data filed at 10/10/14 0715  Gross per 24 hour  Intake   1050 ml  Output  0 ml  Net   1050 ml    Exam:   General:  Pt is alert, follows commands appropriately, not in acute distress  Cardiovascular: Regular rate and rhythm, S1/S2, no murmurs, no rubs, no gallops  Respiratory: Clear to auscultation bilaterally, no wheezing, no crackles, no rhonchi  Abdomen: Soft, tender in epigastric area, non distended, bowel sounds present, no guarding  Extremities: No edema, pulses DP and PT palpable bilaterally  Neuro:  Grossly nonfocal  Data Reviewed: Basic Metabolic Panel:  Recent Labs Lab 10/09/14 1148 10/09/14 1149 10/10/14 0520  NA  --  129* 129*  K  --  3.4* 2.8*  CL  --  89* 90*  CO2  --  28 27  GLUCOSE  --  110* 93  BUN  --  13 11  CREATININE  --  0.80 0.93  CALCIUM  --  9.2 8.8*  MG 1.7  --   --    Liver Function Tests:  Recent Labs Lab 10/09/14 1149 10/10/14 0520  AST 43* 39  ALT 32 28  ALKPHOS 68 65  BILITOT 1.2 1.5*  PROT 7.7 7.5  ALBUMIN 3.9 3.7    Recent Labs Lab 10/09/14 1149 10/10/14 0520  LIPASE 13* 11*   CBC:  Recent Labs Lab 10/08/14 1746 10/09/14 0949 10/09/14 1149 10/10/14 0520  WBC 10.3* 10.7* 10.7* 10.5  NEUTROABS  --   --  8.8*  --   HGB 14.5 14.6 14.7 15.5*  HCT 44.3 45.6 44.2 46.7*  MCV 89.4 90.2 90.4 90.5  PLT  --   --  499* 535*   Scheduled Meds: . aspirin EC  81 mg Oral Daily  . enoxaparin (LOVENOX) injection  40 mg Subcutaneous Q24H  . lisinopril  20 mg Oral Daily   And  . hydrochlorothiazide  12.5 mg Oral Daily  . piperacillin-tazobactam (ZOSYN)  IV  3.375 g Intravenous Q8H  . potassium chloride  40 mEq Oral BID   Continuous Infusions: . sodium chloride 75 mL/hr at 10/09/14 1758

## 2014-10-10 NOTE — H&P (View-Only) (Signed)
General Surgery [facility]  Subjective: Patient with persistent upper abdominal pain radiating to right flank.  NPO.  HIDA preliminary results positive for obstruction / acute cholecystitis.  Objective: Vital signs in last 24 hours: Temp:  [97.9 F (36.6 C)-98.3 F (36.8 C)] 98.3 F (36.8 C) (06/10 0455) Pulse Rate:  [65-75] 74 (06/10 0455) Resp:  [13-20] 16 (06/10 0455) BP: (130-190)/(49-83) 138/64 mmHg (06/10 0455) SpO2:  [94 %-99 %] 99 % (06/10 0455) Weight:  [59.6 kg (131 lb 6.3 oz)] 59.6 kg (131 lb 6.3 oz) (06/09 1716) Last BM Date: 10/07/14 (per pt) NPO Afebrile, VSS Na Intake/Output from previous day: 06/09 0701 - 06/10 0700 In: 1000 [I.V.:1000] Out: -  Intake/Output this shift: Total I/O In: 50 [IV Piggyback:50] Out: -   Exam: HEENT - clear, non-icteric Neck - soft Chest - clear bilat Cor - RRR Abd - soft, minimally distended; mild tenderness upper abdomen, poorly localized; well healed incision lower midline Ext - non-tender without edema  Lab Results:   Recent Labs  10/09/14 1149 10/10/14 0520  WBC 10.7* 10.5  HGB 14.7 15.5*  HCT 44.2 46.7*  PLT 499* 535*    BMET  Recent Labs  10/09/14 1149 10/10/14 0520  NA 129* 129*  K 3.4* 2.8*  CL 89* 90*  CO2 28 27  GLUCOSE 110* 93  BUN 13 11  CREATININE 0.80 0.93  CALCIUM 9.2 8.8*   PT/INR No results for input(s): LABPROT, INR in the last 72 hours.   Recent Labs Lab 10/09/14 1149 10/10/14 0520  AST 43* 39  ALT 32 28  ALKPHOS 68 65  BILITOT 1.2 1.5*  PROT 7.7 7.5  ALBUMIN 3.9 3.7     Lipase     Component Value Date/Time   LIPASE 11* 10/10/2014 0520     Studies/Results: Ct Abdomen Pelvis W Contrast  10/09/2014   CLINICAL DATA:  Abdominal pain extending into the back. Nausea and vomiting. Microhematuria. Question small bowel obstruction.  EXAM: CT ABDOMEN AND PELVIS WITH CONTRAST  TECHNIQUE: Multidetector CT imaging of the abdomen and pelvis was performed using  the standard protocol following bolus administration of intravenous contrast.  CONTRAST:  28mL OMNIPAQUE IOHEXOL 300 MG/ML SOLN, 184mL OMNIPAQUE IOHEXOL 300 MG/ML SOLN  COMPARISON:  Acute abdominal series 10/08/2014  FINDINGS: Moderate dependent airspace disease is present bilaterally. The heart size is normal.  A small amount of fluid can be seen just below the hemidiaphragm and surrounding the liver. Mild periportal edema is present in the liver. Spleen demonstrate scattered calcifications. No acute abnormality is present. The stomach is moderately distended. The duodenum and pancreas are within normal limits.  The common bile duct is enlarged, measuring up to 11 mm. At least 2 large gallstones are present. The larger stone measures up to 3.7 cm. Smaller stone measures 1.4 cm. Increased density material within the gallbladder likely represents sludge. There is some inflammatory change about the gallbladder.  The adrenal glands are normal bilaterally. The kidneys and ureters are within normal limits. The urinary bladder is unremarkable.  A small amount of free fluid within the anatomic pelvis is likely physiologic. Rectosigmoid colon is normal. The more proximal colon aids unremarkable. The appendix is visualized and within normal limits. The distal small bowel is mostly collapsed. The more proximal spot bowel is relatively collapsed as well. Atherosclerotic changes are noted in the distal aorta and branch vessels without aneurysm. Uterus is normal for age. Venous congestion is evident bilaterally. Cystic changes are noted in  the adnexa bilaterally, measuring 2.6 by 2.3 cm on the right. A left-sided adnexal cyst measures 3.2 x 2.4 cm.  Bone windows demonstrate multilevel facet degenerative changes, left greater than right.  IMPRESSION: 1. Marked distention increased density of contents in the gallbladder with a least 2 large stones. This raises concern for cholecystitis. Right upper quadrant ultrasound could be  used for further evaluation if clinically indicated. 2. A small amount of free fluid is present about the liver and layering dependently within the pelvis, concerning for an inflammatory process. 3. Bilateral adnexal cysts, a typical for age. The largest is on the left measuring 3.2 cm. 4. Moderate dependent airspace bilaterally. While this may represent atelectasis, early infection is also considered. 5. Mild periportal edema within the liver.   Electronically Signed   By: San Morelle M.D.   On: 10/09/2014 14:06   Dg Abd Acute W/chest  10/08/2014   CLINICAL DATA:  80 year old female with complaint of generalized abdominal pain for 1 day with some radiation into the lower back.  EXAM: DG ABDOMEN ACUTE W/ 1V CHEST  COMPARISON:  Chest x-ray 07/22/2009.  FINDINGS: Lung volumes are normal. No consolidative airspace disease. No pleural effusions. No pneumothorax. No pulmonary nodule or mass noted. Pulmonary vasculature and the cardiomediastinal silhouette are within normal limits. Atherosclerosis in the thoracic aorta. Surgical clips in the left breast likely from prior lumpectomy.  Gas and stool are seen scattered throughout the colon extending to the level of the distal rectum. No pathologic distension of small bowel is noted. No gross evidence of pneumoperitoneum. Multiple pelvic phleboliths.  IMPRESSION: 1.  Nonobstructive bowel gas pattern. 2. No pneumoperitoneum. 3. Atherosclerosis. 4. No radiographic evidence of acute cardiopulmonary disease.   Electronically Signed   By: Vinnie Langton M.D.   On: 10/08/2014 20:05    Medications: . antiseptic oral rinse  7 mL Mouth Rinse q12n4p  . aspirin EC  81 mg Oral Daily  . chlorhexidine  15 mL Mouth Rinse BID  . enoxaparin (LOVENOX) injection  40 mg Subcutaneous Q24H  . lisinopril  20 mg Oral Daily   And  . hydrochlorothiazide  12.5 mg Oral Daily  . piperacillin-tazobactam (ZOSYN)  IV  3.375 g Intravenous Q8H  . potassium chloride  40 mEq Oral BID     Assessment/Plan Subacute cholecystitis, cholelithiasis, cystic duct obstruction  Continue NPO, IVF  Discussed proceeding today with lap chole and IOC, possible open cholecystectomy - patient understands and wishes to proceed.  Would like to wait until her daughter arrives from [place] at lunchtime today.  Will enter pre-op orders and notify OR.  The risks and benefits of the procedure have been discussed at length with the patient.  The patient understands the proposed procedure, potential alternative treatments, and the course of recovery to be expected.  All of the patient's questions have been answered at this time.  The patient wishes to proceed with surgery.  Earnstine Regal, MD, [facility] Office: [place] 10/10/2014

## 2014-10-10 NOTE — Op Note (Signed)
Procedure Note  Pre-operative Diagnosis:  Acute cholecystitis, cholelithiasis  Post-operative Diagnosis:  same  Surgeon:  Earnstine Regal, MD, FACS  Assistant:  none   Procedure:  Laparoscopic cholecystectomy with intra-operative cholangiography  Anesthesia:  General  Estimated Blood Loss:  minimal  Drains: none         Specimen: Gallbladder to pathology  Indications:  Pt is 80 yo female fairly healthy at baseline who presented to [facility] ED with main concern of two days duration of progressively worsening upper quadrant abd pain, constant and throbbing, 7/10 in severity, occasionally but not consistently radiating to the lower abd quadrants, worse with eating and movement, no specific alleviating factors, associated with nausea and multiple episodes of non bloody vomiting, poor oral intake. Pt denies similar events in the past. Pt denies fevers, chills, chest pain or shortness of breath, no recent sick contacts or exposures. Pt has been seen in urgent care and was noted to have some hematuria, referred to [facility] for further evaluation.  In ED, pt noted to be hemodynamically stable, VSS, CT scan notable for developing cholecystitis.  Patient underwent nuclear med HIDA scan which was positive for acute cholecystitis.  Procedure Details:  The patient was seen in the pre-op holding area. The risks, benefits, complications, treatment options, and expected outcomes were previously discussed with the patient. The patient agreed with the proposed plan and has signed the informed consent form.  The patient was brought to the Operating Room, identified as Valerie Lopez and the procedure verified as laparoscopic cholecystectomy with intraoperative cholangiography. A "time out" was completed and the above information confirmed.  The patient was placed in the supine position. Following induction of general anesthesia, the abdomen was prepped and draped in the usual aseptic fashion.  An incision was made  in the skin near the umbilicus. The midline fascia was incised and the peritoneal cavity was entered and a Hasson canula was introduced under direct vision.  The Hasson canula was secured with a 0-Vicryl pursestring suture. Pneumoperitoneum was established with carbon dioxide. Additional trocars were introduced under direct vision along the right costal margin in the midline, mid-clavicular line, and anterior axillary line.   The gallbladder was identified and the fundus grasped and retracted cephalad. Gallbladder was aspirated with the trocar and contained thick, mucoid bile.  Adhesions were taken down bluntly and the electrocautery was utilized as needed, taking care not to injure any adjacent structures. The infundibulum was grasped and retracted laterally, exposing the peritoneum overlying the triangle of Calot. The peritoneum was incised and structures exposed with blunt dissection. The cystic duct was clearly identified, bluntly dissected circumferentially, and clipped at the neck of the gallbladder.  An incision was made in the cystic duct and the cholangiogram catheter introduced. The catheter was secured using an ligaclip.  Real-time cholangiography was performed using C-arm fluoroscopy.  There was rapid filling of a normal caliber common bile duct.  There was reflux of contrast into the left and right hepatic ductal systems.  There was free flow distally into the duodenum without filling defect or obstruction.  The catheter was removed from the peritoneal cavity.  The cystic duct was then ligated with surgical clips and divided. The cystic artery was identified, dissected circumferentially, ligated with ligaclips, and divided.  The gallbladder was dissected away from the liver bed using the electrocautery for hemostasis. The gallbladder was completely removed from the liver and placed into an endocatch bag. The gallbladder was removed in the endocatch bag through the  umbilical port site and  submitted to pathology for review.  The right upper quadrant was irrigated and the gallbladder bed was inspected. Hemostasis was achieved with the electrocautery.  Pneumoperitoneum was released after viewing removal of the trocars with good hemostasis noted. The umbilical wound was irrigated and the fascia was then closed with the pursestring suture.  Local anesthetic was infiltrated at all port sites. The skin incisions were closed with 4-0 Monocril subcuticular sutures and steri-strips and dressings were applied.  Instrument, sponge, and needle counts were correct at the conclusion of the case.  The patient was awakened from anesthesia and brought to the recovery room in stable condition.  The patient tolerated the procedure well.   Earnstine Regal, MD, [facility] Office: [phone]

## 2014-10-10 NOTE — Interval H&P Note (Signed)
History and Physical Interval Note:  10/10/2014 12:28 PM  Valerie Lopez  has presented today for surgery, with the diagnosis of Cholelithiasis.  The various methods of treatment have been discussed with the patient and family. After consideration of risks, benefits and other options for treatment, the patient has consented to    Procedure(s): LAPAROSCOPIC CHOLECYSTECTOMY WITH INTRAOPERATIVE CHOLANGIOGRAM, Possible Open (N/A) as a surgical intervention .    The patient's history has been reviewed, patient examined, no change in status, stable for surgery.  I have reviewed the patient's chart and labs.  Questions were answered to the patient's satisfaction.    Earnstine Regal, MD, [facility] Office: [place]

## 2014-10-10 NOTE — Anesthesia Procedure Notes (Signed)
Procedure Name: Intubation Date/Time: 10/10/2014 12:52 PM Performed by: Deliah Boston Pre-anesthesia Checklist: Patient identified, Emergency Drugs available, Suction available and Patient being monitored Patient Re-evaluated:Patient Re-evaluated prior to inductionOxygen Delivery Method: Circle System Utilized Preoxygenation: Pre-oxygenation with 100% oxygen Intubation Type: IV induction Ventilation: Mask ventilation without difficulty Laryngoscope Size: Mac and 3 Grade View: Grade I Tube type: Oral Tube size: 7.0 mm Number of attempts: 1 Airway Equipment and Method: Oral airway Placement Confirmation: ETT inserted through vocal cords under direct vision,  positive ETCO2 and breath sounds checked- equal and bilateral Secured at: 21 cm Tube secured with: Tape Dental Injury: Teeth and Oropharynx as per pre-operative assessment

## 2014-10-10 NOTE — Progress Notes (Signed)
General Surgery [facility]  Subjective: Patient with persistent upper abdominal pain radiating to right flank.  NPO.  HIDA preliminary results positive for obstruction / acute cholecystitis.  Objective: Vital signs in last 24 hours: Temp:  [97.9 F (36.6 C)-98.3 F (36.8 C)] 98.3 F (36.8 C) (06/10 0455) Pulse Rate:  [65-75] 74 (06/10 0455) Resp:  [13-20] 16 (06/10 0455) BP: (130-190)/(49-83) 138/64 mmHg (06/10 0455) SpO2:  [94 %-99 %] 99 % (06/10 0455) Weight:  [59.6 kg (131 lb 6.3 oz)] 59.6 kg (131 lb 6.3 oz) (06/09 1716) Last BM Date: 10/07/14 (per pt) NPO Afebrile, VSS Na Intake/Output from previous day: 06/09 0701 - 06/10 0700 In: 1000 [I.V.:1000] Out: -  Intake/Output this shift: Total I/O In: 50 [IV Piggyback:50] Out: -   Exam: HEENT - clear, non-icteric Neck - soft Chest - clear bilat Cor - RRR Abd - soft, minimally distended; mild tenderness upper abdomen, poorly localized; well healed incision lower midline Ext - non-tender without edema  Lab Results:   Recent Labs  10/09/14 1149 10/10/14 0520  WBC 10.7* 10.5  HGB 14.7 15.5*  HCT 44.2 46.7*  PLT 499* 535*    BMET  Recent Labs  10/09/14 1149 10/10/14 0520  NA 129* 129*  K 3.4* 2.8*  CL 89* 90*  CO2 28 27  GLUCOSE 110* 93  BUN 13 11  CREATININE 0.80 0.93  CALCIUM 9.2 8.8*   PT/INR No results for input(s): LABPROT, INR in the last 72 hours.   Recent Labs Lab 10/09/14 1149 10/10/14 0520  AST 43* 39  ALT 32 28  ALKPHOS 68 65  BILITOT 1.2 1.5*  PROT 7.7 7.5  ALBUMIN 3.9 3.7     Lipase     Component Value Date/Time   LIPASE 11* 10/10/2014 0520     Studies/Results: Ct Abdomen Pelvis W Contrast  10/09/2014   CLINICAL DATA:  Abdominal pain extending into the back. Nausea and vomiting. Microhematuria. Question small bowel obstruction.  EXAM: CT ABDOMEN AND PELVIS WITH CONTRAST  TECHNIQUE: Multidetector CT imaging of the abdomen and pelvis was performed using  the standard protocol following bolus administration of intravenous contrast.  CONTRAST:  90mL OMNIPAQUE IOHEXOL 300 MG/ML SOLN, 132mL OMNIPAQUE IOHEXOL 300 MG/ML SOLN  COMPARISON:  Acute abdominal series 10/08/2014  FINDINGS: Moderate dependent airspace disease is present bilaterally. The heart size is normal.  A small amount of fluid can be seen just below the hemidiaphragm and surrounding the liver. Mild periportal edema is present in the liver. Spleen demonstrate scattered calcifications. No acute abnormality is present. The stomach is moderately distended. The duodenum and pancreas are within normal limits.  The common bile duct is enlarged, measuring up to 11 mm. At least 2 large gallstones are present. The larger stone measures up to 3.7 cm. Smaller stone measures 1.4 cm. Increased density material within the gallbladder likely represents sludge. There is some inflammatory change about the gallbladder.  The adrenal glands are normal bilaterally. The kidneys and ureters are within normal limits. The urinary bladder is unremarkable.  A small amount of free fluid within the anatomic pelvis is likely physiologic. Rectosigmoid colon is normal. The more proximal colon aids unremarkable. The appendix is visualized and within normal limits. The distal small bowel is mostly collapsed. The more proximal spot bowel is relatively collapsed as well. Atherosclerotic changes are noted in the distal aorta and branch vessels without aneurysm. Uterus is normal for age. Venous congestion is evident bilaterally. Cystic changes are noted in  the adnexa bilaterally, measuring 2.6 by 2.3 cm on the right. A left-sided adnexal cyst measures 3.2 x 2.4 cm.  Bone windows demonstrate multilevel facet degenerative changes, left greater than right.  IMPRESSION: 1. Marked distention increased density of contents in the gallbladder with a least 2 large stones. This raises concern for cholecystitis. Right upper quadrant ultrasound could be  used for further evaluation if clinically indicated. 2. A small amount of free fluid is present about the liver and layering dependently within the pelvis, concerning for an inflammatory process. 3. Bilateral adnexal cysts, a typical for age. The largest is on the left measuring 3.2 cm. 4. Moderate dependent airspace bilaterally. While this may represent atelectasis, early infection is also considered. 5. Mild periportal edema within the liver.   Electronically Signed   By: San Morelle M.D.   On: 10/09/2014 14:06   Dg Abd Acute W/chest  10/08/2014   CLINICAL DATA:  80 year old female with complaint of generalized abdominal pain for 1 day with some radiation into the lower back.  EXAM: DG ABDOMEN ACUTE W/ 1V CHEST  COMPARISON:  Chest x-ray 07/22/2009.  FINDINGS: Lung volumes are normal. No consolidative airspace disease. No pleural effusions. No pneumothorax. No pulmonary nodule or mass noted. Pulmonary vasculature and the cardiomediastinal silhouette are within normal limits. Atherosclerosis in the thoracic aorta. Surgical clips in the left breast likely from prior lumpectomy.  Gas and stool are seen scattered throughout the colon extending to the level of the distal rectum. No pathologic distension of small bowel is noted. No gross evidence of pneumoperitoneum. Multiple pelvic phleboliths.  IMPRESSION: 1.  Nonobstructive bowel gas pattern. 2. No pneumoperitoneum. 3. Atherosclerosis. 4. No radiographic evidence of acute cardiopulmonary disease.   Electronically Signed   By: Vinnie Langton M.D.   On: 10/08/2014 20:05    Medications: . antiseptic oral rinse  7 mL Mouth Rinse q12n4p  . aspirin EC  81 mg Oral Daily  . chlorhexidine  15 mL Mouth Rinse BID  . enoxaparin (LOVENOX) injection  40 mg Subcutaneous Q24H  . lisinopril  20 mg Oral Daily   And  . hydrochlorothiazide  12.5 mg Oral Daily  . piperacillin-tazobactam (ZOSYN)  IV  3.375 g Intravenous Q8H  . potassium chloride  40 mEq Oral BID     Assessment/Plan Subacute cholecystitis, cholelithiasis, cystic duct obstruction  Continue NPO, IVF  Discussed proceeding today with lap chole and IOC, possible open cholecystectomy - patient understands and wishes to proceed.  Would like to wait until her daughter arrives from [place] at lunchtime today.  Will enter pre-op orders and notify OR.  The risks and benefits of the procedure have been discussed at length with the patient.  The patient understands the proposed procedure, potential alternative treatments, and the course of recovery to be expected.  All of the patient's questions have been answered at this time.  The patient wishes to proceed with surgery.  Earnstine Regal, MD, [facility] Office: [place] 10/10/2014

## 2014-10-10 NOTE — Progress Notes (Addendum)
Subjective:   This chart was scribed for Dr. Delman Cheadle, MD by Erling Conte, ED Scribe. This patient was seen in Room 5 and the patient's care was started at 5:12 PM.    Patient ID: Valerie Lopez, female    DOB: 09/18/1934, 80 y.o.   MRN: [id number]  Abdominal Pain Associated symptoms include nausea and vomiting (1x). Pertinent negatives include no constipation, diarrhea, dysuria, fever, frequency or hematuria.    HPI Comments: Valerie Lopez is a 80 y.o. female who presents to the [facility] complaining of constant, generalized, abdominal pain for 1 day. She states the pain radiates to her lower back. Pt has had associated nausea and 1 episode of vomiting. She reports when she took [place] this morning and had some Ginger Ale and immediately threw up after. She denies nay other episodes of vomiting. She denies any hematemesis. She states nothing makes the pain worse. She has had a bowel movement today.  She denies any h/o indigestion or heartburn. She denies any new medication intake. She denies h/o abdominal surgeries. Her last colonoscopy was 1 year ago and she states the results were unremarkable. She denies any fever, chills, vaginal pain, vaginal discharge or urinary symptoms.    Patient Active Problem List   Diagnosis Date Noted  . Cholecystitis, acute with cholelithiasis 10/10/2014  . Cholecystitis 10/09/2014  . Abdominal pain, acute 10/09/2014  . Abdominal pain   . Thrombocytosis 09/24/2014   Past Medical History  Diagnosis Date  . Hypertension   . UTI (lower urinary tract infection)   . Dyslipidemia   . TIA (transient ischemic attack)   . Trigeminal neuralgia   . Osteoporosis    Past Surgical History  Procedure Laterality Date  . Tubal ligation     Allergies  Allergen Reactions  . Zocor [Simvastatin] Other (See Comments)    Elevated liver enzymes   Prior to Admission medications   Medication Sig Start Date End Date Taking?  Authorizing Provider  aspirin 81 MG tablet Take 81 mg by mouth daily.   Yes Historical Provider, MD  cholecalciferol (VITAMIN D) 1000 UNITS tablet Take 2,000 Units by mouth daily.   Yes Historical Provider, MD  ezetimibe (ZETIA) 10 MG tablet Take 10 mg by mouth daily.   Yes Historical Provider, MD  lisinopril-hydrochlorothiazide (PRINZIDE,ZESTORETIC) 10-12.5 MG per tablet Take 1 tablet by mouth daily.   Yes Historical Provider, MD  Multiple Vitamin (MULTIVITAMIN) capsule Take 1 capsule by mouth daily.   Yes Historical Provider, MD   History   Social History  . Marital Status: Widowed    Spouse Name: N/A  . Number of Children: N/A  . Years of Education: N/A   Occupational History  . Not on file.   Social History Main Topics  . Smoking status: Never Smoker   . Smokeless tobacco: Never Used  . Alcohol Use: No  . Drug Use: No  . Sexual Activity: Not Currently   Other Topics Concern  . Not on file   Social History Narrative    Review of Systems  Constitutional: Negative for fever and chills.  Gastrointestinal: Positive for nausea, vomiting (1x) and abdominal pain. Negative for diarrhea, constipation and blood in stool.  Genitourinary: Negative for dysuria, urgency, frequency, hematuria, flank pain, vaginal bleeding, vaginal discharge, difficulty urinating and vaginal pain.  Musculoskeletal: Positive for back pain (radiates from abdomen).     Objective:   Physical Exam  Constitutional: She is oriented to person, place, and  time. She appears well-developed and well-nourished. No distress.  HENT:  Head: Normocephalic and atraumatic.  Eyes: Conjunctivae and EOM are normal.  Neck: Neck supple. No tracheal deviation present.  Cardiovascular: Normal rate, S1 normal, S2 normal and normal heart sounds.   No murmur heard. Pulmonary/Chest: Effort normal and breath sounds normal. No respiratory distress. She has no wheezes. She has no rhonchi. She has no rales.  Abdominal: She exhibits  no mass. Bowel sounds are decreased. There is no hepatosplenomegaly. There is generalized tenderness. There is no rebound, no guarding and no CVA tenderness.  tympanitic bowel sounds  Musculoskeletal: Normal range of motion.  Neurological: She is alert and oriented to person, place, and time.  Skin: Skin is warm and dry.  Psychiatric: She has a normal mood and affect. Her behavior is normal.  Nursing note and vitals reviewed.  Triage Vitals: BP 122/70 mmHg  Pulse 68  Temp(Src) 98.3 F (36.8 C) (Oral)  Resp 18  Ht 5' 4.25" (1.632 m)  Wt 130 lb 3.2 oz (59.058 kg)  BMI 22.17 kg/m2  SpO2 97%    [facility] reading (PRIMARY) by  Dr. Brigitte Pulse. Abdominal xray:  Moderate stool burden, non-specific bowel gas pattern,  No free air, no air-fluid levels.  No gallstones or kidney stones seen.  Results for orders placed or performed in visit on 10/08/14  Urine culture  Result Value Ref Range   Colony Count 35,000 COLONIES/ML    Organism ID, Bacteria LACTOBACILLUS SPECIES   POCT CBC  Result Value Ref Range   WBC 10.3 (A) 4.6 - 10.2 K/uL   Lymph, poc 1.4 0.6 - 3.4   POC LYMPH PERCENT 14.0 10 - 50 %L   MID (cbc) 0.7 0 - 0.9   POC MID % 6.7 0 - 12 %M   POC Granulocyte 8.2 (A) 2 - 6.9   Granulocyte percent 79.3 37 - 80 %G   RBC 4.95 4.04 - 5.48 M/uL   Hemoglobin 14.5 12.2 - 16.2 g/dL   HCT, POC 44.3 37.7 - 47.9 %   MCV 89.4 80 - 97 fL   MCH, POC 29.2 27 - 31.2 pg   MCHC 32.7 31.8 - 35.4 g/dL   RDW, POC 14.6 %   Platelet Count, POC 555 (A) 142 - 424 K/uL   MPV 6.9 0 - 99.8 fL  POCT UA - Microscopic Only  Result Value Ref Range   WBC, Ur, HPF, POC 1-3    RBC, urine, microscopic 10-20    Bacteria, U Microscopic trace    Mucus, UA trace    Epithelial cells, urine per micros 2-4    Crystals, Ur, HPF, POC neg    Casts, Ur, LPF, POC neg    Yeast, UA neg   POCT urinalysis dipstick  Result Value Ref Range   Color, UA yellow    Clarity, UA clear    Glucose, UA neg    Bilirubin, UA neg    Ketones,  UA 15    Spec Grav, UA 1.025    Blood, UA large    pH, UA 5.5    Protein, UA trace    Urobilinogen, UA 0.2    Nitrite, UA neg    Leukocytes, UA Negative     Assessment & Plan:   1. Generalized abdominal pain   2. Hematuria   zofran helped w/ nausea but pt still w/ severe pain - concerned about constipation w/ narcotics but pt's pain is severe she doesn't think she will be able to  sleep w/o it.  Possibility of constipation causing abd pain but also w/ hematuria - poss kidney stone ?? - no h/o prior. It is currently 7pm - pt to return tomorrow morning to see me for recheck.  Orders Placed This Encounter  Procedures  . Urine culture  . DG Abd Acute W/Chest    Order Specific Question:  Reason for exam:    Answer:  r/o obstruction, vomiting but nml bowels.    Order Specific Question:  Preferred imaging location?    Answer:  External  . Lipase  . POCT CBC  . POCT UA - Microscopic Only  . POCT urinalysis dipstick    Meds ordered this encounter  Medications  . ondansetron (ZOFRAN-ODT) disintegrating tablet 4 mg    Sig:   . DISCONTD: ondansetron (ZOFRAN ODT) 4 MG disintegrating tablet    Sig: Take 1 tablet (4 mg total) by mouth every 8 (eight) hours as needed for nausea or vomiting.    Dispense:  20 tablet    Refill:  0  . DISCONTD: acetaminophen-codeine (TYLENOL #3) 300-30 MG per tablet    Sig: Take 1 tablet by mouth every 4 (four) hours as needed for moderate pain.    Dispense:  30 tablet    Refill:  0    I personally performed the services described in this documentation, which was scribed in my presence. The recorded information has been reviewed and considered, and addended by me as needed.  Delman Cheadle, MD MPH

## 2014-10-10 NOTE — Anesthesia Preprocedure Evaluation (Signed)
Anesthesia Evaluation  Patient identified by MRN, date of birth, ID band Patient awake    Reviewed: Allergy & Precautions, NPO status , Patient's Chart, lab work & pertinent test results  Airway Mallampati: II  TM Distance: >3 FB Neck ROM: Full    Dental no notable dental hx.    Pulmonary neg pulmonary ROS,  breath sounds clear to auscultation  Pulmonary exam normal       Cardiovascular Exercise Tolerance: Good hypertension, Pt. on medications Normal cardiovascular examRhythm:Regular Rate:Normal     Neuro/Psych TIA Neuromuscular disease negative psych ROS   GI/Hepatic negative GI ROS, Neg liver ROS,   Endo/Other  negative endocrine ROS  Renal/GU negative Renal ROS  negative genitourinary   Musculoskeletal negative musculoskeletal ROS (+)   Abdominal   Peds negative pediatric ROS (+)  Hematology negative hematology ROS (+)   Anesthesia Other Findings   Reproductive/Obstetrics negative OB ROS                             Anesthesia Physical Anesthesia Plan  ASA: III  Anesthesia Plan: General   Post-op Pain Management:    Induction: Intravenous  Airway Management Planned: Oral ETT  Additional Equipment:   Intra-op Plan:   Post-operative Plan: Extubation in OR  Informed Consent: I have reviewed the patients History and Physical, chart, labs and discussed the procedure including the risks, benefits and alternatives for the proposed anesthesia with the patient or authorized representative who has indicated his/her understanding and acceptance.   Dental advisory given  Plan Discussed with: CRNA  Anesthesia Plan Comments:         Anesthesia Quick Evaluation

## 2014-10-11 DIAGNOSIS — R100 Acute abdomen: Secondary | ICD-10-CM

## 2014-10-11 LAB — BASIC METABOLIC PANEL
Anion gap: 6 (ref 5–15)
BUN: 12 mg/dL (ref 6–20)
CHLORIDE: 101 mmol/L (ref 101–111)
CO2: 28 mmol/L (ref 22–32)
Calcium: 8.5 mg/dL — ABNORMAL LOW (ref 8.9–10.3)
Creatinine, Ser: 0.81 mg/dL (ref 0.44–1.00)
GFR calc Af Amer: >60 mL/min (ref >60)
GFR calc non Af Amer: >60 mL/min (ref >60)
Glucose, Bld: 122 mg/dL — ABNORMAL HIGH (ref 65–99)
POTASSIUM: 3.7 mmol/L (ref 3.5–5.1)
SODIUM: 135 mmol/L (ref 135–145)

## 2014-10-11 LAB — CBC
HCT: 39.9 % (ref 36.0–46.0)
HEMOGLOBIN: 13.3 g/dL (ref 12.0–15.0)
MCH: 30 pg (ref 26.0–34.0)
MCHC: 33.3 g/dL (ref 30.0–36.0)
MCV: 89.9 fL (ref 78.0–100.0)
PLATELETS: 460 10*3/uL — AB (ref 150–400)
RBC: 4.44 MIL/uL (ref 3.87–5.11)
RDW: 13.5 % (ref 11.5–15.5)
WBC: 11.9 10*3/uL — AB (ref 4.0–10.5)

## 2014-10-11 NOTE — Progress Notes (Signed)
Patient ID: Valerie Lopez, female   DOB: 1935/04/10, 80 y.o.   MRN: [id number] [facility]  Subjective: Pt sore and having some nausea.  Complains of some crampy abdominal pain.  .  Objective: Vital signs in last 24 hours: Temp:  [98.1 F (36.7 C)-98.6 F (37 C)] 98.1 F (36.7 C) (06/11 0506) Pulse Rate:  [80-90] 81 (06/11 0506) Resp:  [8-16] 16 (06/11 0506) BP: (125-151)/(47-78) 151/76 mmHg (06/11 0506) SpO2:  [96 %-100 %] 98 % (06/11 0506) Last BM Date: 10/07/14 clears Afebrile, VSS Na Intake/Output from previous day: 06/10 0701 - 06/11 0700 In: 1847.5 [I.V.:1797.5; IV Piggyback:50] Out: -  Intake/Output this shift:    Exam: HEENT - clear, non-icteric Neck - soft Chest - breathing comfortably Cor - Regular Abd - soft, sl distended Ext - non-tender without edema  Lab Results:   Recent Labs  10/10/14 1750 10/11/14 0730  WBC 7.0 11.9*  HGB 14.5 13.3  HCT 43.4 39.9  PLT 467* 460*    BMET  Recent Labs  10/10/14 0520 10/10/14 1750 10/11/14 0730  NA 129*  --  135  K 2.8*  --  3.7  CL 90*  --  101  CO2 27  --  28  GLUCOSE 93  --  122*  BUN 11  --  12  CREATININE 0.93 0.80 0.81  CALCIUM 8.8*  --  8.5*   PT/INR No results for input(s): LABPROT, INR in the last 72 hours.   Recent Labs Lab 10/09/14 1149 10/10/14 0520  AST 43* 39  ALT 32 28  ALKPHOS 68 65  BILITOT 1.2 1.5*  PROT 7.7 7.5  ALBUMIN 3.9 3.7     Lipase     Component Value Date/Time   LIPASE 11* 10/10/2014 0520     Studies/Results: Dg Cholangiogram Operative  10/10/2014   CLINICAL DATA:  Cholecystectomy for acute cholecystitis.  EXAM: INTRAOPERATIVE CHOLANGIOGRAM  TECHNIQUE: Cholangiographic images from the C-arm fluoroscopic device were submitted for interpretation post-operatively. Please see the procedural report for the amount of contrast and the fluoroscopy time utilized.  COMPARISON:  CT of the abdomen on 10/09/2014  FINDINGS:  Intraoperative imaging obtained with a C-arm demonstrates a normal opacified biliary tree without evidence of obstruction or filling defect. Contrast enters the duodenum normally. No contrast extravasation is seen.  IMPRESSION: Normal intraoperative cholangiogram.   Electronically Signed   By: Aletta Edouard M.D.   On: 10/10/2014 14:59   Nm Hepatobiliary Including Gb  10/10/2014   CLINICAL DATA:  Abdominal pain. Gallbladder distension and cholelithiasis.  EXAM: NUCLEAR MEDICINE HEPATOBILIARY IMAGING  TECHNIQUE: Sequential images of the abdomen were obtained out to 60 minutes following intravenous administration of radiopharmaceutical. 2.4 mg IV morphine given the fact 15 minutes  RADIOPHARMACEUTICALS:  5.0 Tc-60m Choletec IV  COMPARISON:  CT 10/09/2014  FINDINGS: Homogeneous uptake of radiotracer within the liver. Counts are evident within the small bowel by 25 minutes. Gallbladder fails to fill at 15 minutes. IV morphine was administered to contract sphincter of Odi. The gallbladder does not fill after 30 minutes of imaging post morphine.  IMPRESSION: 1. Nonvisualization of the gallbladder after morphine administration is consistent with cystic duct obstruction and cholecystitis. 2. Patent common bile duct. Findings conveyed toWILLARD JENNINGS on 10/10/2014  at10:16.   Electronically Signed   By: Suzy Bouchard M.D.   On: 10/10/2014 10:37   Ct Abdomen Pelvis W Contrast  10/09/2014   CLINICAL DATA:  Abdominal pain extending into the back.  Nausea and vomiting. Microhematuria. Question small bowel obstruction.  EXAM: CT ABDOMEN AND PELVIS WITH CONTRAST  TECHNIQUE: Multidetector CT imaging of the abdomen and pelvis was performed using the standard protocol following bolus administration of intravenous contrast.  CONTRAST:  47mL OMNIPAQUE IOHEXOL 300 MG/ML SOLN, 127mL OMNIPAQUE IOHEXOL 300 MG/ML SOLN  COMPARISON:  Acute abdominal series 10/08/2014  FINDINGS: Moderate dependent airspace disease is present  bilaterally. The heart size is normal.  A small amount of fluid can be seen just below the hemidiaphragm and surrounding the liver. Mild periportal edema is present in the liver. Spleen demonstrate scattered calcifications. No acute abnormality is present. The stomach is moderately distended. The duodenum and pancreas are within normal limits.  The common bile duct is enlarged, measuring up to 11 mm. At least 2 large gallstones are present. The larger stone measures up to 3.7 cm. Smaller stone measures 1.4 cm. Increased density material within the gallbladder likely represents sludge. There is some inflammatory change about the gallbladder.  The adrenal glands are normal bilaterally. The kidneys and ureters are within normal limits. The urinary bladder is unremarkable.  A small amount of free fluid within the anatomic pelvis is likely physiologic. Rectosigmoid colon is normal. The more proximal colon aids unremarkable. The appendix is visualized and within normal limits. The distal small bowel is mostly collapsed. The more proximal spot bowel is relatively collapsed as well. Atherosclerotic changes are noted in the distal aorta and branch vessels without aneurysm. Uterus is normal for age. Venous congestion is evident bilaterally. Cystic changes are noted in the adnexa bilaterally, measuring 2.6 by 2.3 cm on the right. A left-sided adnexal cyst measures 3.2 x 2.4 cm.  Bone windows demonstrate multilevel facet degenerative changes, left greater than right.  IMPRESSION: 1. Marked distention increased density of contents in the gallbladder with a least 2 large stones. This raises concern for cholecystitis. Right upper quadrant ultrasound could be used for further evaluation if clinically indicated. 2. A small amount of free fluid is present about the liver and layering dependently within the pelvis, concerning for an inflammatory process. 3. Bilateral adnexal cysts, a typical for age. The largest is on the left  measuring 3.2 cm. 4. Moderate dependent airspace bilaterally. While this may represent atelectasis, early infection is also considered. 5. Mild periportal edema within the liver.   Electronically Signed   By: San Morelle M.D.   On: 10/09/2014 14:06    Medications: . enoxaparin (LOVENOX) injection  40 mg Subcutaneous Q24H  . lisinopril  20 mg Oral Daily   And  . hydrochlorothiazide  12.5 mg Oral Daily  . piperacillin-tazobactam (ZOSYN)  IV  3.375 g Intravenous Q8H    Assessment/Plan Subacute cholecystitis, cholelithiasis, cystic duct obstruction   POD 1 lap chole for acute cholecystitis. IV antibiotics (total 5-7 days post op, can convert to orals when ready for d/c) Clears. OK to advance diet if nausea resolves. ambulate  [facility] Office: [place] 10/11/2014

## 2014-10-11 NOTE — Progress Notes (Signed)
Patient ID: Valerie Lopez, female   DOB: 1934-08-01, 80 y.o.   MRN: [id number]  [facility] PROGRESS NOTE  Valerie Lopez [facility]:[id number] DOB: 1934/09/28 DOA: 10/09/2014 PCP: Vidal Schwalbe, MD   Brief narrative:    HPI: Pt is 80 yo female fairly healthy at baseline who presented to [facility] ED with main concern of two days duration of progressively worsening upper quadrant abd pain, constant and throbbing, 7/10 in severity, occasionally but not consistently radiating to the lower abd quadrants, worse with eating and movement, no specific alleviating factors, associated with nausea and multiple episodes of non bloody vomiting, poor oral intake. Pt denies similar events in the past. Pt denies fevers, chills, chest pain or shortness of breath, no recent sick contacts or exposures. Pt has been seen in urgent care and was noted to have some hematuria, referred to [facility] for further evaluation.   In ED, pt noted to be hemodynamically stable, VSS, imaging studies notable for ? developing cholecystitis. Surgery consulted and [facility] asked to admit for further evaluation.   Assessment/Plan:    Active Problems: Acute abd pain with N/V - s/p laparoscopic cholecystectomy with intra-operative cholangiography, post op day #1 - still with some abd soreness  - continue to provide analgesia and antiemetics as needed  - continue empiric Zosyn day #3 - advance diet as pt able to tolerate  Hyponatremia - secondary to pre renal etiology - IVF provided and sodium is now WNL  - repeat BMP in AM Leukocytosis - post op related, reactive - no signs of an infectious etiology - repeat CBC in AM Hypokalemia - supplemented and WNL this AM DVT prophylaxis  - Lovenox SQ  Code Status: Full.  Family Communication:  plan of care discussed with the patient Disposition Plan: Home when stable.   IV access:  Peripheral IV  Procedures and diagnostic studies:    Ct Abdomen Pelvis W Contrast 10/09/2014  Marked  distention increased density of contents in the gallbladder with a least 2 large stones. This raises concern for cholecystitis. Right upper quadrant ultrasound could be used for further evaluation if clinically indicated. 2. A small amount of free fluid is present about the liver and layering dependently within the pelvis, concerning for an inflammatory process. 3. Bilateral adnexal cysts, a typical for age. The largest is on the left measuring 3.2 cm. 4. Moderate dependent airspace bilaterally. While this may represent atelectasis, early infection is also considered. 5. Mild periportal edema within the liver.     Dg Abd Acute W/chest 10/08/2014   Nonobstructive bowel gas pattern. 2. No pneumoperitoneum. 3. Atherosclerosis. 4. No radiographic evidence of acute cardiopulmonary disease.     Medical Consultants:  Surgery   Other Consultants:  None  IAnti-Infectives:   Zosyn 6/10 -->  Faye Ramsay, MD  [facility] Pager [phone]  If 7PM-7AM, please contact night-coverage [email or web address] Password [email or web address] 10/11/2014, 7:02 AM   LOS: 2 days   HPI/Subjective: No events overnight.   Objective: Filed Vitals:   10/10/14 1530 10/10/14 1548 10/10/14 2200 10/11/14 0506  BP:  135/52 126/78 151/76  Pulse:  80 90 81  Temp: 98.4 F (36.9 C) 98.3 F (36.8 C) 98.4 F (36.9 C) 98.1 F (36.7 C)  TempSrc:   Oral Oral  Resp:  16 16 16   Height:      Weight:      SpO2:  96% 100% 98%    Intake/Output Summary (Last 24 hours) at 10/11/14 5465 Last data filed at 10/10/14 1900  Gross per  24 hour  Intake 1847.5 ml  Output      0 ml  Net 1847.5 ml    Exam:   General:  Pt is alert, follows commands appropriately, not in acute distress  Cardiovascular: Regular rate and rhythm, S1/S2, no murmurs, no rubs, no gallops  Respiratory: Clear to auscultation bilaterally, no wheezing, no crackles, no rhonchi  Abdomen: Soft, tender in upper abd quadrants, non distended, bowel sounds present, no  guarding  Extremities: No edema, pulses DP and PT palpable bilaterally  Neuro: Grossly nonfocal  Data Reviewed: Basic Metabolic Panel:  Recent Labs Lab 10/09/14 1148 10/09/14 1149 10/10/14 0520 10/10/14 1750  NA  --  129* 129*  --   K  --  3.4* 2.8*  --   CL  --  89* 90*  --   CO2  --  28 27  --   GLUCOSE  --  110* 93  --   BUN  --  13 11  --   CREATININE  --  0.80 0.93 0.80  CALCIUM  --  9.2 8.8*  --   MG 1.7  --   --   --    Liver Function Tests:  Recent Labs Lab 10/09/14 1149 10/10/14 0520  AST 43* 39  ALT 32 28  ALKPHOS 68 65  BILITOT 1.2 1.5*  PROT 7.7 7.5  ALBUMIN 3.9 3.7    Recent Labs Lab 10/09/14 1149 10/10/14 0520  LIPASE 13* 11*   CBC:  Recent Labs Lab 10/08/14 1746 10/09/14 0949 10/09/14 1149 10/10/14 0520 10/10/14 1750  WBC 10.3* 10.7* 10.7* 10.5 7.0  NEUTROABS  --   --  8.8*  --   --   HGB 14.5 14.6 14.7 15.5* 14.5  HCT 44.3 45.6 44.2 46.7* 43.4  MCV 89.4 90.2 90.4 90.5 89.9  PLT  --   --  499* 535* 467*   Scheduled Meds: . aspirin EC  81 mg Oral Daily  . enoxaparin (LOVENOX) injection  40 mg Subcutaneous Q24H  . lisinopril  20 mg Oral Daily   And  . hydrochlorothiazide  12.5 mg Oral Daily  . piperacillin-tazobactam (ZOSYN)  IV  3.375 g Intravenous Q8H  . potassium chloride  40 mEq Oral BID   Continuous Infusions: . dextrose 5 % and 0.45 % NaCl with KCl 20 mEq/L 75 mL/hr at 10/10/14 1622

## 2014-10-12 LAB — BASIC METABOLIC PANEL
ANION GAP: 8 (ref 5–15)
BUN: 8 mg/dL (ref 6–20)
CALCIUM: 8.2 mg/dL — AB (ref 8.9–10.3)
CO2: 28 mmol/L (ref 22–32)
Chloride: 100 mmol/L — ABNORMAL LOW (ref 101–111)
Creatinine, Ser: 0.88 mg/dL (ref 0.44–1.00)
GFR calc Af Amer: >60 mL/min (ref >60)
GLUCOSE: 165 mg/dL — AB (ref 65–99)
Potassium: 3.6 mmol/L (ref 3.5–5.1)
SODIUM: 136 mmol/L (ref 135–145)

## 2014-10-12 NOTE — Progress Notes (Signed)
Patient ID: Valerie Lopez, female   DOB: 11/09/1934, 80 y.o.   MRN: [id number]  [facility] PROGRESS NOTE  Valerie Lopez ZJI:[id number] DOB: January 29, 1935 DOA: 10/09/2014 PCP: Vidal Schwalbe, MD   Brief narrative:    HPI: Pt is 80 yo female fairly healthy at baseline who presented to [facility] ED with main concern of two days duration of progressively worsening upper quadrant abd pain, constant and throbbing, 7/10 in severity, occasionally but not consistently radiating to the lower abd quadrants, worse with eating and movement, no specific alleviating factors, associated with nausea and multiple episodes of non bloody vomiting, poor oral intake. Pt denies similar events in the past. Pt denies fevers, chills, chest pain or shortness of breath, no recent sick contacts or exposures. Pt has been seen in urgent care and was noted to have some hematuria, referred to [facility] for further evaluation.   In ED, pt noted to be hemodynamically stable, VSS, imaging studies notable for ? developing cholecystitis. Surgery consulted and [facility] asked to admit for further evaluation.   Assessment/Plan:    Active Problems: Acute abd pain with N/V - s/p laparoscopic cholecystectomy with intra-operative cholangiography, post op day #2 - still with some abd soreness but much better - continue to provide analgesia and antiemetics as needed  - continue empiric Zosyn day #4/7 - advance diet as pt able to tolerate  Hyponatremia - secondary to pre renal etiology - IVF provided and sodium has stabilized  - repeat BMP in AM Leukocytosis 6/11 - post op related, reactive - no signs of an infectious etiology - repeat CBC in AM Hypokalemia - repeat BMP pending this AM DVT prophylaxis  - Lovenox SQ  Code Status: Full.  Family Communication:  plan of care discussed with the patient Disposition Plan: Home when stable.   IV access:  Peripheral IV  Procedures and diagnostic studies:    Ct Abdomen Pelvis W  Contrast 10/09/2014  Marked distention increased density of contents in the gallbladder with a least 2 large stones. This raises concern for cholecystitis. Right upper quadrant ultrasound could be used for further evaluation if clinically indicated. 2. A small amount of free fluid is present about the liver and layering dependently within the pelvis, concerning for an inflammatory process. 3. Bilateral adnexal cysts, a typical for age. The largest is on the left measuring 3.2 cm. 4. Moderate dependent airspace bilaterally. While this may represent atelectasis, early infection is also considered. 5. Mild periportal edema within the liver.     Dg Abd Acute W/chest 10/08/2014   Nonobstructive bowel gas pattern. 2. No pneumoperitoneum. 3. Atherosclerosis. 4. No radiographic evidence of acute cardiopulmonary disease.     Medical Consultants:  Surgery   Other Consultants:  None  IAnti-Infectives:   Zosyn 6/09 -->  Faye Ramsay, MD  [facility] Pager [phone]  If 7PM-7AM, please contact night-coverage [email or web address] Password [facility] 10/12/2014, 10:29 AM   LOS: 3 days   HPI/Subjective: No events overnight.   Objective: Filed Vitals:   10/11/14 1424 10/11/14 2135 10/11/14 2300 10/12/14 0609  BP: 153/63 171/75 160/74 148/64  Pulse: 86 88  78  Temp: 98.8 F (37.1 C) 98 F (36.7 C)  98.3 F (36.8 C)  TempSrc: Oral Oral  Oral  Resp: 18 18  18   Height:      Weight:      SpO2: 99% 99%  94%    Intake/Output Summary (Last 24 hours) at 10/12/14 1029 Last data filed at 10/11/14 1800  Gross per 24  hour  Intake 1213.75 ml  Output      0 ml  Net 1213.75 ml    Exam:   General:  Pt is alert, follows commands appropriately, not in acute distress  Cardiovascular: Regular rate and rhythm, S1/S2, no murmurs, no rubs, no gallops  Respiratory: Clear to auscultation bilaterally, no wheezing, no crackles, no rhonchi  Abdomen: Soft, non tender, non distended, bowel sounds present, no  guarding  Extremities: No edema, pulses DP and PT palpable bilaterally  Neuro: Grossly nonfocal  Data Reviewed: Basic Metabolic Panel:  Recent Labs Lab 10/09/14 1148 10/09/14 1149 10/10/14 0520 10/10/14 1750 10/11/14 0730  NA  --  129* 129*  --  135  K  --  3.4* 2.8*  --  3.7  CL  --  89* 90*  --  101  CO2  --  28 27  --  28  GLUCOSE  --  110* 93  --  122*  BUN  --  13 11  --  12  CREATININE  --  0.80 0.93 0.80 0.81  CALCIUM  --  9.2 8.8*  --  8.5*  MG 1.7  --   --   --   --    Liver Function Tests:  Recent Labs Lab 10/09/14 1149 10/10/14 0520  AST 43* 39  ALT 32 28  ALKPHOS 68 65  BILITOT 1.2 1.5*  PROT 7.7 7.5  ALBUMIN 3.9 3.7    Recent Labs Lab 10/09/14 1149 10/10/14 0520  LIPASE 13* 11*   CBC:  Recent Labs Lab 10/09/14 0949 10/09/14 1149 10/10/14 0520 10/10/14 1750 10/11/14 0730  WBC 10.7* 10.7* 10.5 7.0 11.9*  NEUTROABS  --  8.8*  --   --   --   HGB 14.6 14.7 15.5* 14.5 13.3  HCT 45.6 44.2 46.7* 43.4 39.9  MCV 90.2 90.4 90.5 89.9 89.9  PLT  --  499* 535* 467* 460*   Scheduled Meds: . aspirin EC  81 mg Oral Daily  . enoxaparin (LOVENOX) injection  40 mg Subcutaneous Q24H  . lisinopril  20 mg Oral Daily   And  . hydrochlorothiazide  12.5 mg Oral Daily  . piperacillin-tazobactam (ZOSYN)  IV  3.375 g Intravenous Q8H  . potassium chloride  40 mEq Oral BID   Continuous Infusions: . dextrose 5 % and 0.45 % NaCl with KCl 20 mEq/L 75 mL/hr at 10/12/14 1010

## 2014-10-12 NOTE — Progress Notes (Signed)
Patient ID: Valerie Lopez, female   DOB: [birth year], 80 y.o.   MRN: [id number] [facility]  Subjective: Pt feeling quite a bit better, but still having bloating.  Has only had 1 small episode of flatus.  Having belching when drinks.    Objective: Vital signs in last 24 hours: Temp:  [98 F (36.7 C)-98.8 F (37.1 C)] 98.3 F (36.8 C) (06/12 0609) Pulse Rate:  [78-88] 78 (06/12 0609) Resp:  [18] 18 (06/12 0609) BP: (148-183)/(63-81) 148/64 mmHg (06/12 0609) SpO2:  [94 %-99 %] 94 % (06/12 0609) Last BM Date: 10/07/14 clears Afebrile, VSS Na Intake/Output from previous day: 06/11 0701 - 06/12 0700 In: 1453.8 [P.O.:600; I.V.:753.8; IV Piggyback:100] Out: -  Intake/Output this shift:    Exam: HEENT - clear, non-icteric Neck - soft Chest - breathing comfortably Cor - Regular Abd - soft, sl distended, but better than yesterday. Ext - non-tender without edema  Lab Results:   Recent Labs  10/10/14 1750 10/11/14 0730  WBC 7.0 11.9*  HGB 14.5 13.3  HCT 43.4 39.9  PLT 467* 460*    BMET  Recent Labs  10/10/14 0520 10/10/14 1750 10/11/14 0730  NA 129*  --  135  K 2.8*  --  3.7  CL 90*  --  101  CO2 27  --  28  GLUCOSE 93  --  122*  BUN 11  --  12  CREATININE 0.93 0.80 0.81  CALCIUM 8.8*  --  8.5*   PT/INR No results for input(s): LABPROT, INR in the last 72 hours.   Recent Labs Lab 10/09/14 1149 10/10/14 0520  AST 43* 39  ALT 32 28  ALKPHOS 68 65  BILITOT 1.2 1.5*  PROT 7.7 7.5  ALBUMIN 3.9 3.7     Lipase     Component Value Date/Time   LIPASE 11* 10/10/2014 0520     Studies/Results: Dg Cholangiogram Operative  10/10/2014   CLINICAL DATA:  Cholecystectomy for acute cholecystitis.  EXAM: INTRAOPERATIVE CHOLANGIOGRAM  TECHNIQUE: Cholangiographic images from the C-arm fluoroscopic device were submitted for interpretation post-operatively. Please see the procedural report for the amount of contrast and the  fluoroscopy time utilized.  COMPARISON:  CT of the abdomen on 10/09/2014  FINDINGS: Intraoperative imaging obtained with a C-arm demonstrates a normal opacified biliary tree without evidence of obstruction or filling defect. Contrast enters the duodenum normally. No contrast extravasation is seen.  IMPRESSION: Normal intraoperative cholangiogram.   Electronically Signed   By: Aletta Edouard M.D.   On: 10/10/2014 14:59   Nm Hepatobiliary Including Gb  10/10/2014   CLINICAL DATA:  Abdominal pain. Gallbladder distension and cholelithiasis.  EXAM: NUCLEAR MEDICINE HEPATOBILIARY IMAGING  TECHNIQUE: Sequential images of the abdomen were obtained out to 60 minutes following intravenous administration of radiopharmaceutical. 2.4 mg IV morphine given the fact 15 minutes  RADIOPHARMACEUTICALS:  5.0 Tc-8m Choletec IV  COMPARISON:  CT 10/09/2014  FINDINGS: Homogeneous uptake of radiotracer within the liver. Counts are evident within the small bowel by 25 minutes. Gallbladder fails to fill at 15 minutes. IV morphine was administered to contract sphincter of Odi. The gallbladder does not fill after 30 minutes of imaging post morphine.  IMPRESSION: 1. Nonvisualization of the gallbladder after morphine administration is consistent with cystic duct obstruction and cholecystitis. 2. Patent common bile duct. Findings conveyed toWILLARD JENNINGS on 10/10/2014  at10:16.   Electronically Signed   By: Suzy Bouchard M.D.   On: 10/10/2014 10:37    Medications: .  enoxaparin (LOVENOX) injection  40 mg Subcutaneous Q24H  . lisinopril  20 mg Oral Daily   And  . hydrochlorothiazide  12.5 mg Oral Daily  . piperacillin-tazobactam (ZOSYN)  IV  3.375 g Intravenous Q8H    Assessment/Plan Subacute cholecystitis, cholelithiasis, cystic duct obstruction   POD 2 lap chole for acute cholecystitis. IV antibiotics (total 5-7 days post op, can convert to orals when ready for d/c) Advance diet. ambulate  [facility] Office: [place] 10/12/2014

## 2014-10-13 ENCOUNTER — Encounter (HOSPITAL_COMMUNITY): Payer: Self-pay | Admitting: Surgery

## 2014-10-13 DIAGNOSIS — R109 Unspecified abdominal pain: Secondary | ICD-10-CM

## 2014-10-13 LAB — BASIC METABOLIC PANEL
ANION GAP: 11 (ref 5–15)
BUN: 9 mg/dL (ref 6–20)
CO2: 29 mmol/L (ref 22–32)
CREATININE: 0.9 mg/dL (ref 0.44–1.00)
Calcium: 9 mg/dL (ref 8.9–10.3)
Chloride: 96 mmol/L — ABNORMAL LOW (ref 101–111)
GFR calc non Af Amer: >60 mL/min (ref >60)
Glucose, Bld: 95 mg/dL (ref 65–99)
Potassium: 4 mmol/L (ref 3.5–5.1)
SODIUM: 136 mmol/L (ref 135–145)

## 2014-10-13 LAB — CBC
HCT: 41.2 % (ref 36.0–46.0)
HEMOGLOBIN: 13.3 g/dL (ref 12.0–15.0)
MCH: 29.4 pg (ref 26.0–34.0)
MCHC: 32.3 g/dL (ref 30.0–36.0)
MCV: 90.9 fL (ref 78.0–100.0)
PLATELETS: 514 10*3/uL — AB (ref 150–400)
RBC: 4.53 MIL/uL (ref 3.87–5.11)
RDW: 13.7 % (ref 11.5–15.5)
WBC: 9 10*3/uL (ref 4.0–10.5)

## 2014-10-13 MED ORDER — CIPROFLOXACIN HCL 500 MG PO TABS: 500.0000 mg | ORAL_TABLET | Freq: Two times a day (BID) | ORAL | Status: DC

## 2014-10-13 MED ORDER — POLYETHYLENE GLYCOL 3350 17 G PO PACK: 17.0000 g | PACK | Freq: Every day | ORAL | Status: DC

## 2014-10-13 MED ORDER — BISACODYL 10 MG RE SUPP
10.0000 mg | Freq: Once | RECTAL | Status: AC
  Administered 2014-10-13: 10 mg via RECTAL
  Filled 2014-10-13: qty 1

## 2014-10-13 MED ORDER — HYDROCODONE-ACETAMINOPHEN 5-325 MG PO TABS: 1.0000 | ORAL_TABLET | ORAL | Status: DC | PRN

## 2014-10-13 MED ORDER — POLYETHYLENE GLYCOL 3350 17 G PO PACK
17.0000 g | PACK | Freq: Every day | ORAL | Status: DC
  Administered 2014-10-13: 17 g via ORAL
  Filled 2014-10-13: qty 1

## 2014-10-13 MED ORDER — ONDANSETRON HCL 4 MG PO TABS: 4.0000 mg | ORAL_TABLET | Freq: Four times a day (QID) | ORAL | Status: DC | PRN

## 2014-10-13 NOTE — Discharge Instructions (Signed)
[facility] ______[facility] °LAPAROSCOPIC SURGERY: POST OP INSTRUCTIONS °Always review your discharge instruction sheet given to you by the facility where your surgery was performed. °IF YOU HAVE DISABILITY OR FAMILY LEAVE FORMS, YOU MUST BRING THEM TO THE OFFICE FOR PROCESSING.   °DO NOT GIVE THEM TO YOUR DOCTOR. ° °1. A prescription for pain medication may be given to you upon discharge.  Take your pain medication as prescribed, if needed.  If narcotic pain medicine is not needed, then you may take acetaminophen (Tylenol) or ibuprofen (Advil) as needed. °2. Take your usually prescribed medications unless otherwise directed. °3. If you need a refill on your pain medication, please contact your pharmacy.  They will contact our office to request authorization. Prescriptions will not be filled after 5pm or on week-ends. °4. You should follow a light diet the first few days after arrival home, such as soup and crackers, etc.  Be sure to include lots of fluids daily. °5. Most patients will experience some swelling and bruising in the area of the incisions.  Ice packs will help.  Swelling and bruising can take several days to resolve.  °6. It is common to experience some constipation if taking pain medication after surgery.  Increasing fluid intake and taking a stool softener (such as Colace) will usually help or prevent this problem from occurring.  A mild laxative (Milk of Magnesia or Miralax) should be taken according to package instructions if there are no bowel movements after 48 hours. °7. Unless discharge instructions indicate otherwise, you may remove your bandages 24-48 hours after surgery, and you may shower at that time.  You may have steri-strips (small skin tapes) in place directly over the incision.  These strips should be left on the skin for 7-10 days.  If your surgeon used skin glue on the incision, you may shower in 24 hours.  The glue will flake off over the next 2-3 weeks.  Any sutures or  staples will be removed at the office during your follow-up visit. °8. ACTIVITIES:  You may resume regular (light) daily activities beginning the next day--such as daily self-care, walking, climbing stairs--gradually increasing activities as tolerated.  You may have sexual intercourse when it is comfortable.  Refrain from any heavy lifting or straining until approved by your doctor. °a. You may drive when you are no longer taking prescription pain medication, you can comfortably wear a seatbelt, and you can safely maneuver your car and apply brakes. °b. RETURN TO WORK:  __________________________________________________________ °9. You should see your doctor in the office for a follow-up appointment approximately 2-3 weeks after your surgery.  Make sure that you call for this appointment within a day or two after you arrive home to insure a convenient appointment time. °10. OTHER INSTRUCTIONS: __________________________________________________________________________________________________________________________ __________________________________________________________________________________________________________________________ °WHEN TO CALL YOUR DOCTOR: °1. Fever over 101.0 °2. Inability to urinate °3. Continued bleeding from incision. °4. Increased pain, redness, or drainage from the incision. °5. Increasing abdominal pain ° °The clinic staff is available to answer your questions during regular business hours.  Please don’t hesitate to call and ask to speak to one of the nurses for clinical concerns.  If you have a medical emergency, go to the nearest emergency room or call 911.  A surgeon from [facility] is always on call at the hospital. °[address], [address], [place], [place]  [address] ? [address] [address], [place], [place]   [address] °[phone] ? [phone] ? FAX [phone] °Web site:   [email or web address] °

## 2014-10-13 NOTE — Progress Notes (Signed)
Patient ID: QUORRA ROSENE, female   DOB: 1935/03/26, 80 y.o.   MRN: [id number] 3 Days Post-Op  Subjective: Pt feeling better.  Tolerating solid diet.  No BM since last Tuesday.  Passing some flatus.  Objective: Vital signs in last 24 hours: Temp:  [98.2 F (36.8 C)-98.8 F (37.1 C)] 98.2 F (36.8 C) (06/13 0458) Pulse Rate:  [78-81] 78 (06/13 0458) Resp:  [18-20] 18 (06/13 0458) BP: (123-154)/(62-90) 123/90 mmHg (06/13 0458) SpO2:  [92 %-97 %] 92 % (06/13 0458) Last BM Date: 10/07/14  Intake/Output from previous day: 06/12 0701 - 06/13 0700 In: 600 [P.O.:600] Out: -  Intake/Output this shift:    PE: Abd: soft, appropriately tender, +BS, ND, incisions c/d/i with gauze and tape present as well  Lab Results:   Recent Labs  10/11/14 0730 10/13/14 0516  WBC 11.9* 9.0  HGB 13.3 13.3  HCT 39.9 41.2  PLT 460* 514*   BMET  Recent Labs  10/12/14 1059 10/13/14 0516  NA 136 136  K 3.6 4.0  CL 100* 96*  CO2 28 29  GLUCOSE 165* 95  BUN 8 9  CREATININE 0.88 0.90  CALCIUM 8.2* 9.0   PT/INR No results for input(s): LABPROT, INR in the last 72 hours. CMP     Component Value Date/Time   NA 136 10/13/2014 0516   NA 138 09/24/2014 1042   K 4.0 10/13/2014 0516   K 3.9 09/24/2014 1042   CL 96* 10/13/2014 0516   CO2 29 10/13/2014 0516   CO2 29 09/24/2014 1042   GLUCOSE 95 10/13/2014 0516   GLUCOSE 77 09/24/2014 1042   BUN 9 10/13/2014 0516   BUN 11.9 09/24/2014 1042   CREATININE 0.90 10/13/2014 0516   CREATININE 0.9 09/24/2014 1042   CALCIUM 9.0 10/13/2014 0516   CALCIUM 8.8 09/24/2014 1042   PROT 7.5 10/10/2014 0520   PROT 7.1 09/24/2014 1042   ALBUMIN 3.7 10/10/2014 0520   ALBUMIN 3.6 09/24/2014 1042   AST 39 10/10/2014 0520   AST 20 09/24/2014 1042   ALT 28 10/10/2014 0520   ALT 9 09/24/2014 1042   ALKPHOS 65 10/10/2014 0520   ALKPHOS 68 09/24/2014 1042   BILITOT 1.5* 10/10/2014 0520   BILITOT 0.46 09/24/2014 1042   GFRNONAA >60 10/13/2014 0516   GFRAA >60 10/13/2014 0516   Lipase     Component Value Date/Time   LIPASE 11* 10/10/2014 0520       Studies/Results: No results found.  Anti-infectives: Anti-infectives    Start     Dose/Rate Route Frequency Ordered Stop   10/09/14 1800  piperacillin-tazobactam (ZOSYN) IVPB 3.375 g     3.375 g 12.5 mL/hr over 240 Minutes Intravenous Every 8 hours 10/09/14 1628         Assessment/Plan  Subacute cholecystitis, cholelithiasis, cystic duct obstruction  POD 3 lap chole for acute cholecystitis.  -patient surgically stable for DC home today from our standpoint -will give dulcolax suppository -Zosyn D4/7.  Can be switched to oral abx and finish out the course -will set up follow up info -lovenox/SCDs  LOS: 4 days    Kaedyn Belardo E 10/13/2014, 8:10 AM Pager: [phone]

## 2014-10-13 NOTE — Discharge Summary (Signed)
Physician Discharge Summary  Valerie Lopez BOF:[id number] DOB: 1935/04/03 DOA: 10/09/2014  PCP: Vidal Schwalbe, MD  Admit date: 10/09/2014 Discharge date: 10/13/2014  Recommendations for Outpatient Follow-up:  1. Pt will need to follow up with PCP in 2-3 weeks post discharge 2. Please obtain BMP to evaluate electrolytes and kidney function 3. Please also check CBC to evaluate Hg and Hct levels  Discharge Diagnoses:  Active Problems:   Cholecystitis   Abdominal pain, acute   Cholecystitis, acute with cholelithiasis   Discharge Condition: Stable  Diet recommendation: Heart healthy diet discussed in details     Brief narrative:    HPI: Pt is 80 yo female fairly healthy at baseline who presented to [facility] ED with main concern of two days duration of progressively worsening upper quadrant abd pain, constant and throbbing, 7/10 in severity, occasionally but not consistently radiating to the lower abd quadrants, worse with eating and movement, no specific alleviating factors, associated with nausea and multiple episodes of non bloody vomiting, poor oral intake. Pt denies similar events in the past. Pt denies fevers, chills, chest pain or shortness of breath, no recent sick contacts or exposures. Pt has been seen in urgent care and was noted to have some hematuria, referred to [facility] for further evaluation.   In ED, pt noted to be hemodynamically stable, VSS, imaging studies notable for ? developing cholecystitis. Surgery consulted and [facility] asked to admit for further evaluation.   Assessment/Plan:    Active Problems: Acute abd pain with N/V - s/p laparoscopic cholecystectomy with intra-operative cholangiography, post op day #3 - still with some abd soreness but much better - continue to provide analgesia and antiemetics as needed  - continued empiric Zosyn and will transition to oral Cipro upon discharge to complete threapy  Hyponatremia - secondary to pre renal etiology - IVF  provided and sodium has stabilized  Leukocytosis 6/11 - post op related, reactive - no signs of an infectious etiology Hypokalemia - supplemented and WNL  Code Status: Full.  Family Communication: plan of care discussed with the patient Disposition Plan: Home   IV access:  Peripheral IV  Procedures and diagnostic studies:   Ct Abdomen Pelvis W Contrast 10/09/2014 Marked distention increased density of contents in the gallbladder with a least 2 large stones. This raises concern for cholecystitis. Right upper quadrant ultrasound could be used for further evaluation if clinically indicated. 2. A small amount of free fluid is present about the liver and layering dependently within the pelvis, concerning for an inflammatory process. 3. Bilateral adnexal cysts, a typical for age. The largest is on the left measuring 3.2 cm. 4. Moderate dependent airspace bilaterally. While this may represent atelectasis, early infection is also considered. 5. Mild periportal edema within the liver.   Dg Abd Acute W/chest 10/08/2014 Nonobstructive bowel gas pattern. 2. No pneumoperitoneum. 3. Atherosclerosis. 4. No radiographic evidence of acute cardiopulmonary disease.   Medical Consultants:  Surgery  Other Consultants:  None  IAnti-Infectives:   Zosyn 6/09 -->       Discharge Exam: Filed Vitals:   10/13/14 0458  BP: 123/90  Pulse: 78  Temp: 98.2 F (36.8 C)  Resp: 18   Filed Vitals:   10/11/14 2300 10/12/14 0609 10/12/14 2031 10/13/14 0458  BP: 160/74 148/64 154/62 123/90  Pulse:  78 81 78  Temp:  98.3 F (36.8 C) 98.8 F (37.1 C) 98.2 F (36.8 C)  TempSrc:  Oral Oral Oral  Resp:  18 20 18   Height:  Weight:      SpO2:  94% 97% 92%    General: Pt is alert, follows commands appropriately, not in acute distress Cardiovascular: Regular rate and rhythm, S1/S2 +, no murmurs, no rubs, no gallops Respiratory: Clear to auscultation bilaterally, no  wheezing, no crackles, no rhonchi Abdominal: Soft, non tender, non distended, bowel sounds +, no guarding Extremities: no edema, no cyanosis, pulses palpable bilaterally DP and P TNeuro: Grossly nonfocal  Discharge Instructions     Medication List    TAKE these medications        aspirin EC 81 MG tablet  Take 81 mg by mouth daily.     ciprofloxacin 500 MG tablet  Commonly known as:  CIPRO  Take 1 tablet (500 mg total) by mouth 2 (two) times daily.     ezetimibe 10 MG tablet  Commonly known as:  ZETIA  Take 10 mg by mouth daily.     HYDROcodone-acetaminophen 5-325 MG per tablet  Commonly known as:  NORCO/VICODIN  Take 1-2 tablets by mouth every 4 (four) hours as needed for moderate pain.     Krill Oil 300 MG Caps  Take 1 capsule by mouth daily.     lisinopril-hydrochlorothiazide 20-12.5 MG per tablet  Commonly known as:  PRINZIDE,ZESTORETIC  Take 1 tablet by mouth daily.     ondansetron 4 MG tablet  Commonly known as:  ZOFRAN  Take 1 tablet (4 mg total) by mouth every 6 (six) hours as needed for nausea.     polyethylene glycol packet  Commonly known as:  MIRALAX / GLYCOLAX  Take 17 g by mouth daily.             Follow-up Information    Follow up with [facility] On 11/04/2014.   Specialty:  General Surgery   Why:  Doc of the [facility], 1:30pm, arrive no later than 1:00pm for paperwork   Contact information:   [place] [address] [phone]       Follow up with Vidal Schwalbe, MD.   Specialty:  Family Medicine   Contact information:   [address] [place] [address] [phone]        The results of significant diagnostics from this hospitalization (including imaging, microbiology, ancillary and laboratory) are listed below for reference.     Microbiology: Recent Results (from the past 240 hour(s))  Urine culture     Status: None   Collection Time: 10/08/14  6:33 PM  Result Value  Ref Range Status   Colony Count 35,000 COLONIES/ML  Final   Organism ID, Bacteria LACTOBACILLUS SPECIES  Final    Comment: Standardized susceptibility testing for this organism is not available.   Surgical pcr screen     Status: Abnormal   Collection Time: 10/10/14 11:35 AM  Result Value Ref Range Status   MRSA, PCR NEGATIVE NEGATIVE Final   Staphylococcus aureus POSITIVE (A) NEGATIVE Final    Comment:        The Xpert SA Assay (FDA approved for NASAL specimens in patients over 19 years of age), is one component of a comprehensive surveillance program.  Test performance has been validated by [facility] for patients greater than or equal to 66 year old. It is not intended to diagnose infection nor to guide or monitor treatment.      Labs: Basic Metabolic Panel:  Recent Labs Lab 10/09/14 1148  10/09/14 1149 10/10/14 0520 10/10/14 1750 10/11/14 0730 10/12/14 1059  10/13/14 0516  NA  --   --  129* 129*  --  135 136 136  K  --   --  3.4* 2.8*  --  3.7 3.6 4.0  CL  --   --  89* 90*  --  101 100* 96*  CO2  --   --  28 27  --  28 28 29   GLUCOSE  --   --  110* 93  --  122* 165* 95  BUN  --   --  13 11  --  12 8 9   CREATININE  --   < > 0.80 0.93 0.80 0.81 0.88 0.90  CALCIUM  --   --  9.2 8.8*  --  8.5* 8.2* 9.0  MG 1.7  --   --   --   --   --   --   --   < > = values in this interval not displayed. Liver Function Tests:  Recent Labs Lab 10/09/14 1149 10/10/14 0520  AST 43* 39  ALT 32 28  ALKPHOS 68 65  BILITOT 1.2 1.5*  PROT 7.7 7.5  ALBUMIN 3.9 3.7    Recent Labs Lab 10/09/14 1149 10/10/14 0520  LIPASE 13* 11*   No results for input(s): AMMONIA in the last 168 hours. CBC:  Recent Labs Lab 10/09/14 1149 10/10/14 0520 10/10/14 1750 10/11/14 0730 10/13/14 0516  WBC 10.7* 10.5 7.0 11.9* 9.0  NEUTROABS 8.8*  --   --   --   --   HGB 14.7 15.5* 14.5 13.3 13.3  HCT 44.2 46.7* 43.4 39.9 41.2  MCV 90.4 90.5 89.9 89.9 90.9  PLT 499* 535* 467* 460* 514*    Cardiac Enzymes: No results for input(s): CKTOTAL, CKMB, CKMBINDEX, TROPONINI in the last 168 hours. BNP: BNP (last 3 results) No results for input(s): BNP in the last 8760 hours.  ProBNP (last 3 results) No results for input(s): PROBNP in the last 8760 hours.  CBG: No results for input(s): GLUCAP in the last 168 hours.   SIGNED: Time coordinating discharge:  30 minutes  Faye Ramsay, MD  [facility] 10/13/2014, 11:31 AM Pager [phone]  If 7PM-7AM, please contact night-coverage [email or web address] Password [email or web address]

## 2014-10-14 ENCOUNTER — Telehealth: Payer: Self-pay | Admitting: Internal Medicine

## 2014-10-14 NOTE — Telephone Encounter (Signed)
returned call and s.w. pt and cx appt per pt request °

## 2014-10-15 ENCOUNTER — Ambulatory Visit: Payer: Medicare HMO | Admitting: Physician Assistant

## 2014-10-15 ENCOUNTER — Other Ambulatory Visit: Payer: Medicare HMO

## 2014-10-19 NOTE — Anesthesia Postprocedure Evaluation (Signed)
  Anesthesia Post-op Note  Patient: Valerie Lopez  Procedure(s) Performed: Procedure(s) (LRB): LAPAROSCOPIC CHOLECYSTECTOMY WITH INTRAOPERATIVE CHOLANGIOGRAM (N/A)  Patient Location: PACU  Anesthesia Type: General  Level of Consciousness: awake and alert   Airway and Oxygen Therapy: Patient Spontanous Breathing  Post-op Pain: mild  Post-op Assessment: Post-op Vital signs reviewed, Patient's Cardiovascular Status Stable, Respiratory Function Stable, Patent Airway and No signs of Nausea or vomiting  Last Vitals:  Filed Vitals:   10/13/14 0458  BP: 123/90  Pulse: 78  Temp: 36.8 C  Resp: 18    Post-op Vital Signs: stable   Complications: No apparent anesthesia complications

## 2014-10-29 ENCOUNTER — Telehealth: Payer: Self-pay | Admitting: Internal Medicine

## 2014-10-29 NOTE — Telephone Encounter (Signed)
pt called to r/s cx appt....pt ok adn aware of new d.t

## 2014-11-10 ENCOUNTER — Telehealth: Payer: Self-pay | Admitting: Physician Assistant

## 2014-11-10 ENCOUNTER — Encounter: Payer: Self-pay | Admitting: Physician Assistant

## 2014-11-10 ENCOUNTER — Ambulatory Visit (HOSPITAL_BASED_OUTPATIENT_CLINIC_OR_DEPARTMENT_OTHER): Payer: Medicare HMO | Admitting: Physician Assistant

## 2014-11-10 ENCOUNTER — Other Ambulatory Visit (HOSPITAL_BASED_OUTPATIENT_CLINIC_OR_DEPARTMENT_OTHER): Payer: Medicare HMO

## 2014-11-10 VITALS — BP 177/80 | HR 73 | Temp 98.2°F | Resp 18 | Ht 65.5 in | Wt 126.5 lb

## 2014-11-10 DIAGNOSIS — Z5111 Encounter for antineoplastic chemotherapy: Secondary | ICD-10-CM | POA: Insufficient documentation

## 2014-11-10 DIAGNOSIS — D75839 Thrombocytosis, unspecified: Secondary | ICD-10-CM

## 2014-11-10 DIAGNOSIS — D473 Essential (hemorrhagic) thrombocythemia: Secondary | ICD-10-CM

## 2014-11-10 LAB — CBC WITH DIFFERENTIAL/PLATELET
BASO%: 1.3 % (ref 0.0–2.0)
Basophils Absolute: 0.1 10*3/uL (ref 0.0–0.1)
EOS ABS: 0.2 10*3/uL (ref 0.0–0.5)
EOS%: 3.6 % (ref 0.0–7.0)
HCT: 41.1 % (ref 34.8–46.6)
HGB: 13.6 g/dL (ref 11.6–15.9)
LYMPH#: 2 10*3/uL (ref 0.9–3.3)
LYMPH%: 32.3 % (ref 14.0–49.7)
MCH: 29.8 pg (ref 25.1–34.0)
MCHC: 33.1 g/dL (ref 31.5–36.0)
MCV: 90.1 fL (ref 79.5–101.0)
MONO#: 0.5 10*3/uL (ref 0.1–0.9)
MONO%: 7.5 % (ref 0.0–14.0)
NEUT%: 55.3 % (ref 38.4–76.8)
NEUTROS ABS: 3.4 10*3/uL (ref 1.5–6.5)
Platelets: 470 10*3/uL — ABNORMAL HIGH (ref 145–400)
RBC: 4.57 10*6/uL (ref 3.70–5.45)
RDW: 14.6 % — ABNORMAL HIGH (ref 11.2–14.5)
WBC: 6.1 10*3/uL (ref 3.9–10.3)

## 2014-11-10 MED ORDER — HYDROXYUREA 500 MG PO CAPS: 500.0000 mg | ORAL_CAPSULE | Freq: Every day | ORAL | Status: DC

## 2014-11-10 NOTE — Patient Instructions (Signed)
You have essential thrombocythemia, a genetic/molecular abnormality causing over production of platelets.  Take Hydrea (hydroxyurea) 500 mg by mouth at bedtime Follow up in ine month

## 2014-11-10 NOTE — Progress Notes (Addendum)
No images are attached to the encounter. No scans are attached to the encounter. No scans are attached to the encounter[place] [id number]  DIAGNOSIS: Essential thrombocythemia  PRIOR THERAPY: none  CURRENT THERAPY: To begin Hydrea 500 mg by mouth daily -expected start date 11/11/2014  INTERVAL HISTORY: Valerie Lopez 80 y.o. female returns for a scheduled regular office visit for followup of her thrombocytosis. She was seen by Dr. Julien Nordmann 09/24/2014 for initial consultation. At that visit he ordered several studies including iron studies as well as JAK2. She presents to discuss results. Since being evaluated by Dr. Julien Nordmann on 09/24/2014 she was hospitalized in mid June and underwent laparoscopic cholecystectomy on 10/10/2014 under the care of  Dr. Barry Dienes for acute cholecystitis. She has for most part recovered from this although still has some abdominal soreness and feels full. She states that part of the scanning workup she received revealed some cysts on her ovaries. Her primary care physician, Dr. Harlan Stains, is working this up further with more specialized imaging studies. I'll these studies were performed at a know follow-up facility and are not immediately available for my review.  MEDICAL HISTORY: Past Medical History  Diagnosis Date  . Hypertension   . UTI (lower urinary tract infection)   . Dyslipidemia   . TIA (transient ischemic attack)   . Trigeminal neuralgia   . Osteoporosis     ALLERGIES:  is allergic to zocor.  MEDICATIONS:  Current Outpatient Prescriptions  Medication Sig Dispense Refill  . aspirin EC 81 MG tablet Take 81 mg by mouth daily.    . ciprofloxacin (CIPRO) 500 MG tablet Take 1 tablet (500 mg total) by mouth 2 (two) times daily. 6 tablet 0  . ezetimibe (ZETIA) 10 MG tablet Take 10 mg by mouth daily.    Marland Kitchen HYDROcodone-acetaminophen  (NORCO/VICODIN) 5-325 MG per tablet Take 1-2 tablets by mouth every 4 (four) hours as needed for moderate pain. 30 tablet 0  . Krill Oil 300 MG CAPS Take 1 capsule by mouth daily.    Marland Kitchen lisinopril-hydrochlorothiazide (PRINZIDE,ZESTORETIC) 20-12.5 MG per tablet Take 1 tablet by mouth daily.    . ondansetron (ZOFRAN) 4 MG tablet Take 1 tablet (4 mg total) by mouth every 6 (six) hours as needed for nausea. 20 tablet 0  . polyethylene glycol (MIRALAX / GLYCOLAX) packet Take 17 g by mouth daily. 14 each 0  . hydroxyurea (HYDREA) 500 MG capsule Take 1 capsule (500 mg total) by mouth daily. May take with food to minimize GI side effects. 30 capsule 2   No current facility-administered medications for this visit.    SURGICAL HISTORY:  Past Surgical History  Procedure Laterality Date  . Tubal ligation    . Cholecystectomy N/A 10/10/2014    Procedure: LAPAROSCOPIC CHOLECYSTECTOMY WITH INTRAOPERATIVE CHOLANGIOGRAM;  Surgeon: Armandina Gemma, MD;  Location: [facility];  Service: General;  Laterality: N/A;    REVIEW OF SYSTEMS:  Review of Systems  Constitutional: Negative for fever, chills, weight loss, malaise/fatigue and diaphoresis.  HENT: Negative for congestion, ear discharge, ear pain, hearing loss, nosebleeds, sore throat and tinnitus.   Eyes: Negative for blurred vision, double vision, photophobia, pain, discharge and redness.  Respiratory: Negative for cough, hemoptysis, sputum production, shortness of breath, wheezing and stridor.   Cardiovascular: Negative for chest pain, palpitations, orthopnea, claudication, leg swelling and PND.  Gastrointestinal: Positive for abdominal pain. Negative for heartburn, nausea,  vomiting, diarrhea, constipation, blood in stool and melena.       Status post laparoscopic cholecystectomy on 10/10/2014. Still has some residual abdominal soreness and feels full.  Genitourinary: Negative.   Musculoskeletal: Negative.   Skin: Negative.   Neurological: Negative for dizziness,  tingling, focal weakness, seizures, weakness and headaches.  Endo/Heme/Allergies: Does not bruise/bleed easily.  Psychiatric/Behavioral: Negative for depression. The patient is not nervous/anxious and does not have insomnia.      PHYSICAL EXAMINATION: Physical Exam  Constitutional: She is oriented to person, place, and time and well-developed, well-nourished, and in no distress.  HENT:  Head: Normocephalic and atraumatic.  Mouth/Throat: Oropharynx is clear and moist.  Eyes: Pupils are equal, round, and reactive to light.  Neck: Normal range of motion. Neck supple. No JVD present. No tracheal deviation present. No thyromegaly present.  Cardiovascular: Normal rate, regular rhythm, normal heart sounds and intact distal pulses.  Exam reveals no gallop and no friction rub.   No murmur heard. Pulmonary/Chest: Effort normal and breath sounds normal. No respiratory distress. She has no wheezes. She has no rales.  Abdominal: Soft. Bowel sounds are normal. She exhibits no distension and no mass. There is no tenderness.  Musculoskeletal: Normal range of motion. She exhibits no edema or tenderness.  Lymphadenopathy:    She has no cervical adenopathy.  Neurological: She is alert and oriented to person, place, and time. She has normal reflexes. Gait normal.  Skin: Skin is warm and dry. No rash noted.  Well-healed surgical incisions from recent laparoscopic cholecystectomy    ECOG PERFORMANCE STATUS: 0 - Asymptomatic  Blood pressure 177/80, pulse 73, temperature 98.2 F (36.8 C), temperature source Oral, resp. rate 18, height 5' 5.5" (1.664 m), weight 126 lb 8 oz (57.38 kg), SpO2 99 %.  LABORATORY DATA: Lab Results  Component Value Date   WBC 6.1 11/10/2014   HGB 13.6 11/10/2014   HCT 41.1 11/10/2014   MCV 90.1 11/10/2014   PLT 470* 11/10/2014      Chemistry      Component Value Date/Time   NA 136 10/13/2014 0516   NA 138 09/24/2014 1042   K 4.0 10/13/2014 0516   K 3.9 09/24/2014 1042    CL 96* 10/13/2014 0516   CO2 29 10/13/2014 0516   CO2 29 09/24/2014 1042   BUN 9 10/13/2014 0516   BUN 11.9 09/24/2014 1042   CREATININE 0.90 10/13/2014 0516   CREATININE 0.9 09/24/2014 1042      Component Value Date/Time   CALCIUM 9.0 10/13/2014 0516   CALCIUM 8.8 09/24/2014 1042   ALKPHOS 65 10/10/2014 0520   ALKPHOS 68 09/24/2014 1042   AST 39 10/10/2014 0520   AST 20 09/24/2014 1042   ALT 28 10/10/2014 0520   ALT 9 09/24/2014 1042   BILITOT 1.5* 10/10/2014 0520   BILITOT 0.46 09/24/2014 1042     09/24/2014 Jack 2 mutation: positive  RADIOGRAPHIC STUDIES:  No results found.   ASSESSMENT/PLAN:  No problem-specific assessment & plan notes found for this encounter.  patient is a pleasant 80 year old African-American female being evaluated for thrombocytosis. Her iron studies were normal Her Barnabas Lister 2 mutation analysis was positive and she will need a treatment for her essential thrombocythemia. Patient was discussed with and also seen by Dr. Julien Nordmann. She'll be treated with Hydrea 500 mg by mouth daily to be taken at bedtime. A prescription for 30 capsules with 2 refills was sent to her pharmacy of record via E scribe. She understands that treatment is  necessary to reduce her risk for possible for stroke related to the thrombocythemia. She'll follow-up in one month with a repeat CBC differential and C met for reevaluation.  Awilda Metro E, PA-C 11/10/2014  All questions were answered. The patient knows to call the clinic with any problems, questions or concerns. We can certainly see the patient much sooner if necessary.  ADDENDUM: Hematology/Oncology Attending: I had a face to face encounter with the patient today. I recommended her care plan. This is a very pleasant 80 years old African-American female recently diagnosed with essential thrombocythemia with positive JAK 2 mutation. The patient also recovering from a recent cholecystectomy. I recommended for her to start  treatment with hydroxyurea 500 mg by mouth daily. We will continue to monitor her closely with repeat blood work in one month's period. She was advised to call immediately if she has any concerning symptoms in the interval.  Disclaimer: This note was dictated with voice recognition software. Similar sounding words can inadvertently be transcribed and may be missed upon review. Eilleen Kempf., MD 11/10/2014

## 2014-11-10 NOTE — Telephone Encounter (Signed)
Pt confirmed labs/ov per 07/11 POF, gave pt AVS and Calendar.... KJ °

## 2014-11-11 ENCOUNTER — Other Ambulatory Visit: Payer: Self-pay | Admitting: *Deleted

## 2014-12-08 ENCOUNTER — Other Ambulatory Visit (HOSPITAL_BASED_OUTPATIENT_CLINIC_OR_DEPARTMENT_OTHER): Payer: Medicare HMO

## 2014-12-08 ENCOUNTER — Encounter: Payer: Self-pay | Admitting: Internal Medicine

## 2014-12-08 ENCOUNTER — Ambulatory Visit (HOSPITAL_BASED_OUTPATIENT_CLINIC_OR_DEPARTMENT_OTHER): Payer: Medicare HMO | Admitting: Internal Medicine

## 2014-12-08 ENCOUNTER — Telehealth: Payer: Self-pay | Admitting: Internal Medicine

## 2014-12-08 VITALS — BP 182/65 | HR 62 | Temp 98.3°F | Resp 18 | Ht 65.5 in | Wt 128.2 lb

## 2014-12-08 DIAGNOSIS — D473 Essential (hemorrhagic) thrombocythemia: Secondary | ICD-10-CM | POA: Diagnosis not present

## 2014-12-08 DIAGNOSIS — D75839 Thrombocytosis, unspecified: Secondary | ICD-10-CM

## 2014-12-08 LAB — COMPREHENSIVE METABOLIC PANEL (CC13)
ALBUMIN: 3.6 g/dL (ref 3.5–5.0)
ALK PHOS: 69 U/L (ref 40–150)
ALT: 10 U/L (ref 0–55)
AST: 18 U/L (ref 5–34)
Anion Gap: 7 mEq/L (ref 3–11)
BILIRUBIN TOTAL: 0.4 mg/dL (ref 0.20–1.20)
BUN: 14.4 mg/dL (ref 7.0–26.0)
CO2: 32 mEq/L — ABNORMAL HIGH (ref 22–29)
CREATININE: 1 mg/dL (ref 0.6–1.1)
Calcium: 9.6 mg/dL (ref 8.4–10.4)
Chloride: 104 mEq/L (ref 98–109)
EGFR: 63 mL/min/{1.73_m2} — ABNORMAL LOW (ref >90)
Glucose: 83 mg/dl (ref 70–140)
Potassium: 3.8 mEq/L (ref 3.5–5.1)
Sodium: 143 mEq/L (ref 136–145)
Total Protein: 7 g/dL (ref 6.4–8.3)

## 2014-12-08 LAB — CBC WITH DIFFERENTIAL/PLATELET
BASO%: 1.1 % (ref 0.0–2.0)
Basophils Absolute: 0.1 10*3/uL (ref 0.0–0.1)
EOS ABS: 0.2 10*3/uL (ref 0.0–0.5)
EOS%: 3.7 % (ref 0.0–7.0)
HCT: 40.7 % (ref 34.8–46.6)
HEMOGLOBIN: 13.5 g/dL (ref 11.6–15.9)
LYMPH%: 38.9 % (ref 14.0–49.7)
MCH: 30.3 pg (ref 25.1–34.0)
MCHC: 33.2 g/dL (ref 31.5–36.0)
MCV: 91.3 fL (ref 79.5–101.0)
MONO#: 0.4 10*3/uL (ref 0.1–0.9)
MONO%: 7 % (ref 0.0–14.0)
NEUT%: 49.3 % (ref 38.4–76.8)
NEUTROS ABS: 3 10*3/uL (ref 1.5–6.5)
PLATELETS: 463 10*3/uL — AB (ref 145–400)
RBC: 4.46 10*6/uL (ref 3.70–5.45)
RDW: 14.2 % (ref 11.2–14.5)
WBC: 6.2 10*3/uL (ref 3.9–10.3)
lymph#: 2.4 10*3/uL (ref 0.9–3.3)

## 2014-12-08 NOTE — Telephone Encounter (Signed)
Gave patient avs report and appointments for February 2017.  °

## 2014-12-08 NOTE — Progress Notes (Signed)
[facility] Telephone:([phone]   Fax:([phone]) [place], [place] [address]  DIAGNOSIS: Essential thrombocythemia with positive JAK 2 mutation diagnosed in July 2016.   PRIOR THERAPY: None  CURRENT THERAPY: Observation.  INTERVAL HISTORY: Valerie STANDRE 80 y.o. female returns to the clinic today for follow-up visit. The patient was supposed to start treatment with hydroxyurea but she was concerned about the adverse effect and she decided not to start the treatment. She is feeling fine today with no specific complaints. She denied having any significant bleeding issues. She has no chest pain, shortness of breath, cough or hemoptysis. She denied having any significant weight loss or night sweats. The patient had repeat CBC and comprehensive metabolic panel performed earlier today and she is here for evaluation and discussion of her lab results.  MEDICAL HISTORY: Past Medical History  Diagnosis Date  . Hypertension   . UTI (lower urinary tract infection)   . Dyslipidemia   . TIA (transient ischemic attack)   . Trigeminal neuralgia   . Osteoporosis     ALLERGIES:  is allergic to zocor.  MEDICATIONS:  Current Outpatient Prescriptions  Medication Sig Dispense Refill  . aspirin EC 81 MG tablet Take 81 mg by mouth daily.    . ciprofloxacin (CIPRO) 500 MG tablet Take 1 tablet (500 mg total) by mouth 2 (two) times daily. (Patient not taking: Reported on 11/11/2014) 6 tablet 0  . ezetimibe (ZETIA) 10 MG tablet Take 10 mg by mouth daily.    . hydroxyurea (HYDREA) 500 MG capsule Take 500 mg by mouth daily.  2  . Krill Oil 300 MG CAPS Take 1 capsule by mouth daily.    Marland Kitchen lisinopril-hydrochlorothiazide (PRINZIDE,ZESTORETIC) 20-12.5 MG per tablet Take 1 tablet by mouth daily.     No current facility-administered medications for this visit.    SURGICAL HISTORY:  Past Surgical History    Procedure Laterality Date  . Tubal ligation    . Cholecystectomy N/A 10/10/2014    Procedure: LAPAROSCOPIC CHOLECYSTECTOMY WITH INTRAOPERATIVE CHOLANGIOGRAM;  Surgeon: Armandina Gemma, MD;  Location: [facility];  Service: General;  Laterality: N/A;    REVIEW OF SYSTEMS:  A comprehensive review of systems was negative.   PHYSICAL EXAMINATION: General appearance: alert, cooperative and no distress Head: Normocephalic, without obvious abnormality, atraumatic Neck: no adenopathy, no JVD, supple, symmetrical, trachea midline and thyroid not enlarged, symmetric, no tenderness/mass/nodules Lymph nodes: Cervical, supraclavicular, and axillary nodes normal. Resp: clear to auscultation bilaterally Back: symmetric, no curvature. ROM normal. No CVA tenderness. Cardio: regular rate and rhythm, S1, S2 normal, no murmur, click, rub or gallop GI: soft, non-tender; bowel sounds normal; no masses,  no organomegaly Extremities: extremities normal, atraumatic, no cyanosis or edema  ECOG PERFORMANCE STATUS: 1 - Symptomatic but completely ambulatory  Blood pressure 182/65, pulse 62, temperature 98.3 F (36.8 C), temperature source Oral, resp. rate 18, height 5' 5.5" (1.664 m), weight 128 lb 3.2 oz (58.151 kg), SpO2 98 %.  LABORATORY DATA: Lab Results  Component Value Date   WBC 6.2 12/08/2014   HGB 13.5 12/08/2014   HCT 40.7 12/08/2014   MCV 91.3 12/08/2014   PLT 463* 12/08/2014      Chemistry      Component Value Date/Time   NA 143 12/08/2014 1408   NA 136 10/13/2014 0516   K 3.8 12/08/2014 1408   K 4.0 10/13/2014 0516   CL 96*  10/13/2014 0516   CO2 32* 12/08/2014 1408   CO2 29 10/13/2014 0516   BUN 14.4 12/08/2014 1408   BUN 9 10/13/2014 0516   CREATININE 1.0 12/08/2014 1408   CREATININE 0.90 10/13/2014 0516      Component Value Date/Time   CALCIUM 9.6 12/08/2014 1408   CALCIUM 9.0 10/13/2014 0516   ALKPHOS 69 12/08/2014 1408   ALKPHOS 65 10/10/2014 0520   AST 18 12/08/2014 1408   AST 39  10/10/2014 0520   ALT 10 12/08/2014 1408   ALT 28 10/10/2014 0520   BILITOT 0.40 12/08/2014 1408   BILITOT 1.5* 10/10/2014 0520       RADIOGRAPHIC STUDIES: No results found.  ASSESSMENT AND PLAN: This is a very pleasant 80 years old white female recently diagnosed with essential thrombocythemia with positive JAK-2 mutation.  Her recent CBC showed stable but mildly elevated platelets count.  The patient has some concern about the adverse effect of hydroxyurea and she would like to continue on observation for now. I agreed with her request for now but the patient understands that she may require treatment in the future if her platelets count starts to rise. The patient would see her primary care physician in 3 months with repeat blood work. I will arrange for her to come back for follow-up visit in 6 months for reevaluation with repeat CBC, comprehensive metabolic panel and LDH. She was advised to call immediately if she has any concerning symptoms in the interval. The patient voices understanding of current disease status and treatment options and is in agreement with the current care plan.  All questions were answered. The patient knows to call the clinic with any problems, questions or concerns. We can certainly see the patient much sooner if necessary.   Disclaimer: This note was dictated with voice recognition software. Similar sounding words can inadvertently be transcribed and may not be corrected upon review.

## 2015-03-04 DIAGNOSIS — Z23 Encounter for immunization: Secondary | ICD-10-CM | POA: Diagnosis not present

## 2015-03-04 DIAGNOSIS — I10 Essential (primary) hypertension: Secondary | ICD-10-CM | POA: Diagnosis not present

## 2015-03-04 DIAGNOSIS — M810 Age-related osteoporosis without current pathological fracture: Secondary | ICD-10-CM | POA: Diagnosis not present

## 2015-03-04 DIAGNOSIS — D473 Essential (hemorrhagic) thrombocythemia: Secondary | ICD-10-CM | POA: Diagnosis not present

## 2015-03-04 DIAGNOSIS — Z0000 Encounter for general adult medical examination without abnormal findings: Secondary | ICD-10-CM | POA: Diagnosis not present

## 2015-03-04 DIAGNOSIS — E785 Hyperlipidemia, unspecified: Secondary | ICD-10-CM | POA: Diagnosis not present

## 2015-03-05 ENCOUNTER — Other Ambulatory Visit: Payer: Self-pay | Admitting: Medical Oncology

## 2015-06-10 ENCOUNTER — Other Ambulatory Visit: Payer: Medicare HMO

## 2015-06-10 ENCOUNTER — Ambulatory Visit: Payer: Medicare HMO | Admitting: Internal Medicine

## 2015-07-21 DIAGNOSIS — I10 Essential (primary) hypertension: Secondary | ICD-10-CM | POA: Diagnosis not present

## 2015-07-21 DIAGNOSIS — E785 Hyperlipidemia, unspecified: Secondary | ICD-10-CM | POA: Diagnosis not present

## 2015-07-21 DIAGNOSIS — Z66 Do not resuscitate: Secondary | ICD-10-CM | POA: Diagnosis not present

## 2015-07-21 DIAGNOSIS — Z809 Family history of malignant neoplasm, unspecified: Secondary | ICD-10-CM | POA: Diagnosis not present

## 2015-07-21 DIAGNOSIS — Z6822 Body mass index (BMI) 22.0-22.9, adult: Secondary | ICD-10-CM | POA: Diagnosis not present

## 2015-07-21 DIAGNOSIS — Z8673 Personal history of transient ischemic attack (TIA), and cerebral infarction without residual deficits: Secondary | ICD-10-CM | POA: Diagnosis not present

## 2015-07-21 DIAGNOSIS — Z9049 Acquired absence of other specified parts of digestive tract: Secondary | ICD-10-CM | POA: Diagnosis not present

## 2015-07-21 DIAGNOSIS — Z8249 Family history of ischemic heart disease and other diseases of the circulatory system: Secondary | ICD-10-CM | POA: Diagnosis not present

## 2015-07-21 DIAGNOSIS — Z7982 Long term (current) use of aspirin: Secondary | ICD-10-CM | POA: Diagnosis not present

## 2015-07-21 DIAGNOSIS — H524 Presbyopia: Secondary | ICD-10-CM | POA: Diagnosis not present

## 2015-10-06 DIAGNOSIS — I10 Essential (primary) hypertension: Secondary | ICD-10-CM | POA: Diagnosis not present

## 2015-10-06 DIAGNOSIS — D473 Essential (hemorrhagic) thrombocythemia: Secondary | ICD-10-CM | POA: Diagnosis not present

## 2015-10-06 DIAGNOSIS — E785 Hyperlipidemia, unspecified: Secondary | ICD-10-CM | POA: Diagnosis not present

## 2015-10-18 ENCOUNTER — Emergency Department (HOSPITAL_COMMUNITY)
Admission: EM | Admit: 2015-10-18 | Discharge: 2015-10-18 | Disposition: A | Discharge status: Home / Self Care | Payer: Medicare HMO | Attending: Emergency Medicine | Admitting: Emergency Medicine

## 2015-10-18 ENCOUNTER — Emergency Department (HOSPITAL_COMMUNITY): Payer: Medicare HMO

## 2015-10-18 ENCOUNTER — Encounter (HOSPITAL_COMMUNITY): Payer: Self-pay

## 2015-10-18 DIAGNOSIS — S2091XA Abrasion of unspecified parts of thorax, initial encounter: Secondary | ICD-10-CM | POA: Diagnosis not present

## 2015-10-18 DIAGNOSIS — S50812A Abrasion of left forearm, initial encounter: Secondary | ICD-10-CM | POA: Diagnosis not present

## 2015-10-18 DIAGNOSIS — Z8673 Personal history of transient ischemic attack (TIA), and cerebral infarction without residual deficits: Secondary | ICD-10-CM | POA: Diagnosis not present

## 2015-10-18 DIAGNOSIS — Z7982 Long term (current) use of aspirin: Secondary | ICD-10-CM | POA: Insufficient documentation

## 2015-10-18 DIAGNOSIS — R0789 Other chest pain: Secondary | ICD-10-CM | POA: Insufficient documentation

## 2015-10-18 DIAGNOSIS — Y999 Unspecified external cause status: Secondary | ICD-10-CM | POA: Insufficient documentation

## 2015-10-18 DIAGNOSIS — I10 Essential (primary) hypertension: Secondary | ICD-10-CM | POA: Diagnosis not present

## 2015-10-18 DIAGNOSIS — Y93F2 Activity, caregiving, lifting: Secondary | ICD-10-CM | POA: Insufficient documentation

## 2015-10-18 DIAGNOSIS — Y92481 Parking lot as the place of occurrence of the external cause: Secondary | ICD-10-CM | POA: Diagnosis not present

## 2015-10-18 MED ORDER — TETANUS-DIPHTH-ACELL PERTUSSIS 5-2.5-18.5 LF-MCG/0.5 IM SUSP
0.5000 mL | Freq: Once | INTRAMUSCULAR | Status: AC
  Administered 2015-10-18: 0.5 mL via INTRAMUSCULAR
  Filled 2015-10-18: qty 0.5

## 2015-10-18 MED ORDER — TRAMADOL HCL 50 MG PO TABS: 50.0000 mg | ORAL_TABLET | Freq: Four times a day (QID) | ORAL | Status: DC | PRN
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
## 2015-10-18 NOTE — ED Notes (Signed)
Patient transported to X-ray 

## 2015-11-09 DIAGNOSIS — S40812D Abrasion of left upper arm, subsequent encounter: Secondary | ICD-10-CM | POA: Diagnosis not present

## 2015-11-09 DIAGNOSIS — S20219D Contusion of unspecified front wall of thorax, subsequent encounter: Secondary | ICD-10-CM | POA: Diagnosis not present

## 2015-11-09 DIAGNOSIS — H9193 Unspecified hearing loss, bilateral: Secondary | ICD-10-CM | POA: Diagnosis not present

## 2015-12-01 DIAGNOSIS — H6902 Patulous Eustachian tube, left ear: Secondary | ICD-10-CM | POA: Diagnosis not present

## 2015-12-01 DIAGNOSIS — S0991XD Unspecified injury of ear, subsequent encounter: Secondary | ICD-10-CM | POA: Diagnosis not present

## 2015-12-01 DIAGNOSIS — H903 Sensorineural hearing loss, bilateral: Secondary | ICD-10-CM | POA: Diagnosis not present

## 2016-01-21 DIAGNOSIS — H6903 Patulous Eustachian tube, bilateral: Secondary | ICD-10-CM | POA: Diagnosis not present

## 2016-01-21 DIAGNOSIS — H6062 Unspecified chronic otitis externa, left ear: Secondary | ICD-10-CM | POA: Diagnosis not present

## 2016-02-05 DIAGNOSIS — Z1231 Encounter for screening mammogram for malignant neoplasm of breast: Secondary | ICD-10-CM | POA: Diagnosis not present

## 2016-02-08 DIAGNOSIS — Z23 Encounter for immunization: Secondary | ICD-10-CM | POA: Diagnosis not present

## 2016-08-25 DIAGNOSIS — L989 Disorder of the skin and subcutaneous tissue, unspecified: Secondary | ICD-10-CM | POA: Diagnosis not present

## 2016-08-25 DIAGNOSIS — I7300 Raynaud's syndrome without gangrene: Secondary | ICD-10-CM | POA: Diagnosis not present

## 2016-08-25 DIAGNOSIS — D473 Essential (hemorrhagic) thrombocythemia: Secondary | ICD-10-CM | POA: Diagnosis not present

## 2016-08-25 DIAGNOSIS — E785 Hyperlipidemia, unspecified: Secondary | ICD-10-CM | POA: Diagnosis not present

## 2016-08-25 DIAGNOSIS — I10 Essential (primary) hypertension: Secondary | ICD-10-CM | POA: Diagnosis not present

## 2016-10-06 DIAGNOSIS — D473 Essential (hemorrhagic) thrombocythemia: Secondary | ICD-10-CM | POA: Diagnosis not present

## 2016-10-06 DIAGNOSIS — I7300 Raynaud's syndrome without gangrene: Secondary | ICD-10-CM | POA: Diagnosis not present

## 2016-10-06 DIAGNOSIS — Z6821 Body mass index (BMI) 21.0-21.9, adult: Secondary | ICD-10-CM | POA: Diagnosis not present

## 2016-10-06 DIAGNOSIS — R768 Other specified abnormal immunological findings in serum: Secondary | ICD-10-CM | POA: Diagnosis not present

## 2016-10-12 DIAGNOSIS — H269 Unspecified cataract: Secondary | ICD-10-CM | POA: Diagnosis not present

## 2016-10-12 DIAGNOSIS — H524 Presbyopia: Secondary | ICD-10-CM | POA: Diagnosis not present

## 2017-01-20 IMAGING — CT CT ABD-PELV W/ CM
2 of 5 series · 16 of 46 positions shown, 18 images · IV contrast (OMNIPAQUE 300)
Comparison: Acute abdominal series 10/08/2014

CLINICAL DATA: Abdominal pain extending into the back. Nausea and
vomiting. Microhematuria. Question small bowel obstruction.

EXAM:
CT ABDOMEN AND PELVIS WITH CONTRAST
TECHNIQUE: Multidetector CT imaging of the abdomen and pelvis was performed
using the standard protocol following bolus administration of
intravenous contrast.
CONTRAST:  50mL OMNIPAQUE IOHEXOL 300 MG/ML SOLN, 100mL OMNIPAQUE
IOHEXOL 300 MG/ML SOLN

[Series 2: abd/pel with · axial · 0.69mm/px · z∈[-262,+108]mm · 13 of 84 slices shown, 15 images]
[im 5/84  soft-tissue]
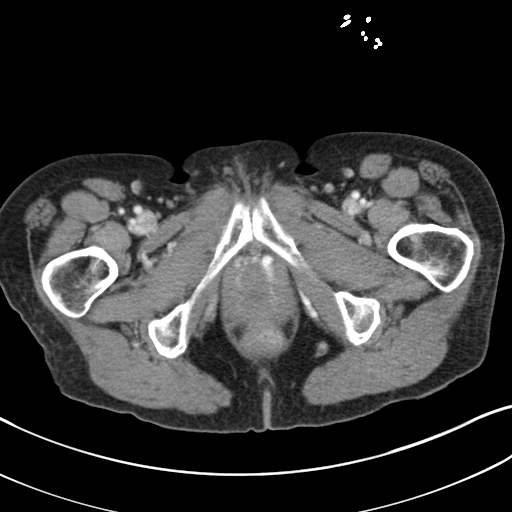
[im 5/84  bone]
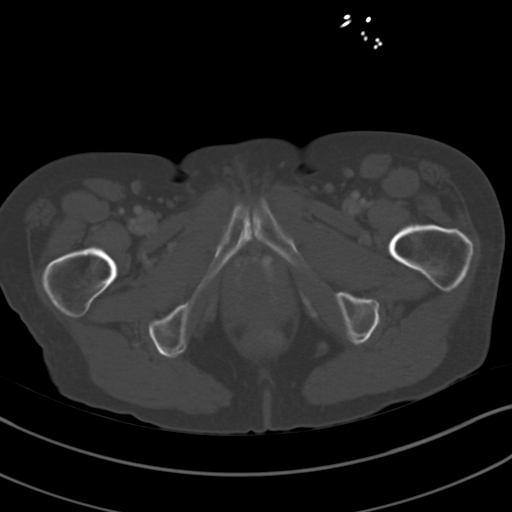
[im 14/84  soft-tissue]
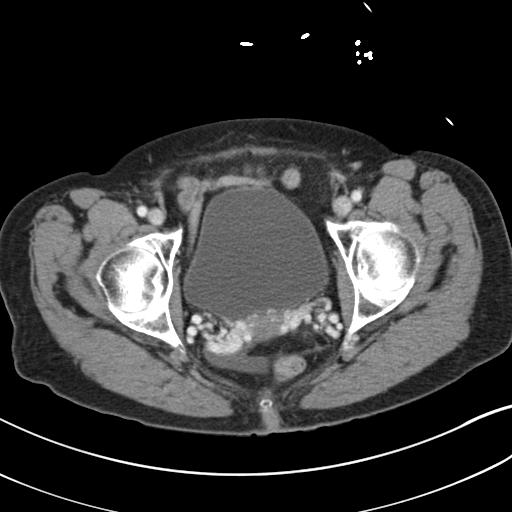
[im 18/84  soft-tissue]
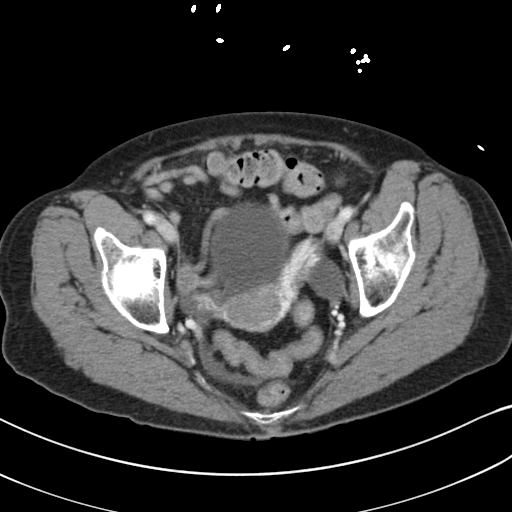
[im 22/84  soft-tissue]
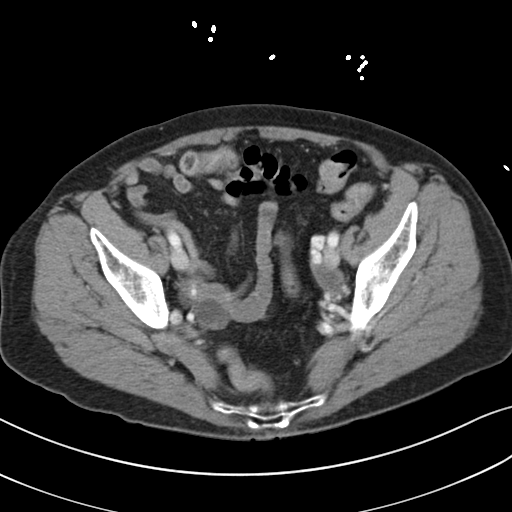
[im 31/84  soft-tissue]
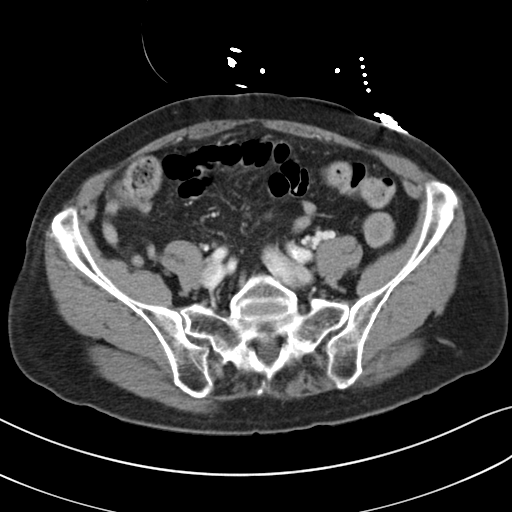
[im 35/84  soft-tissue]
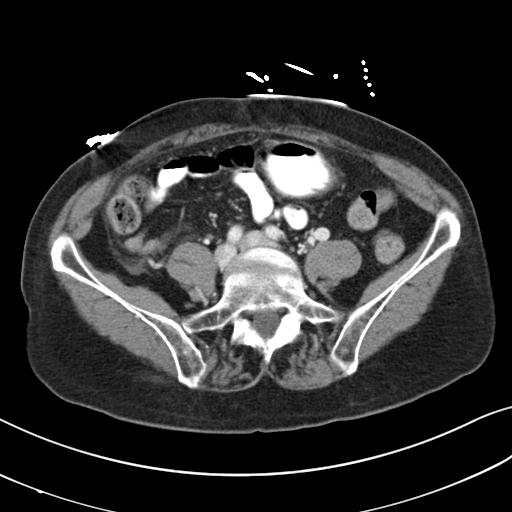
[im 44/84  soft-tissue]
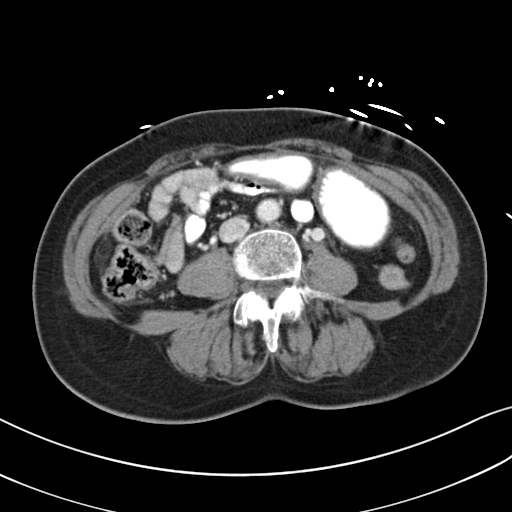
[im 49/84  soft-tissue]
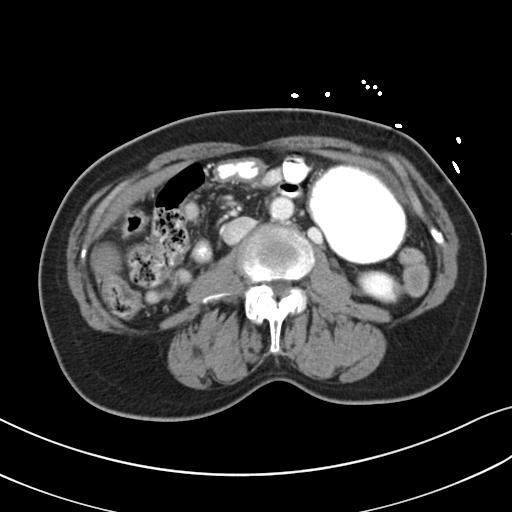
[im 53/84  soft-tissue]
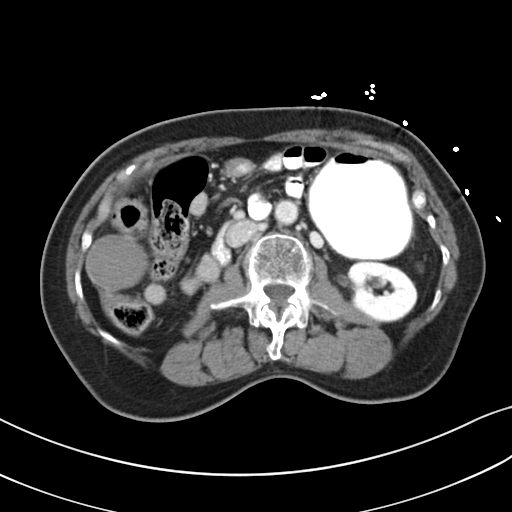
[im 53/84  bone]
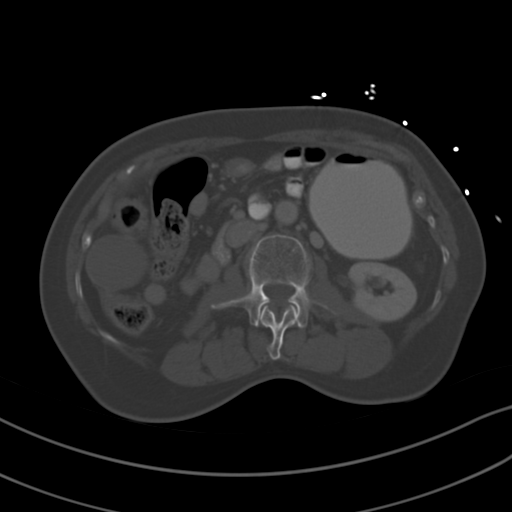
[im 62/84  soft-tissue]
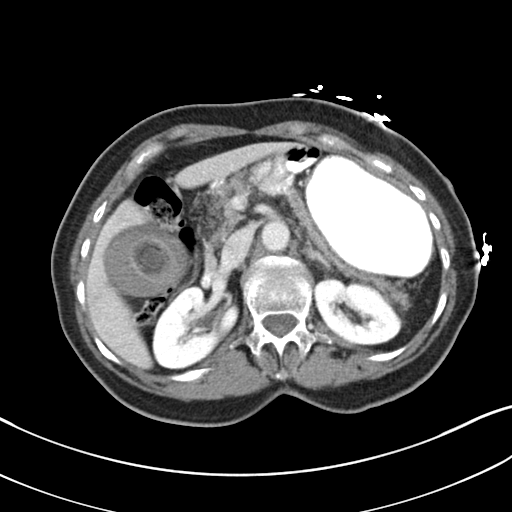
[im 66/84  soft-tissue]
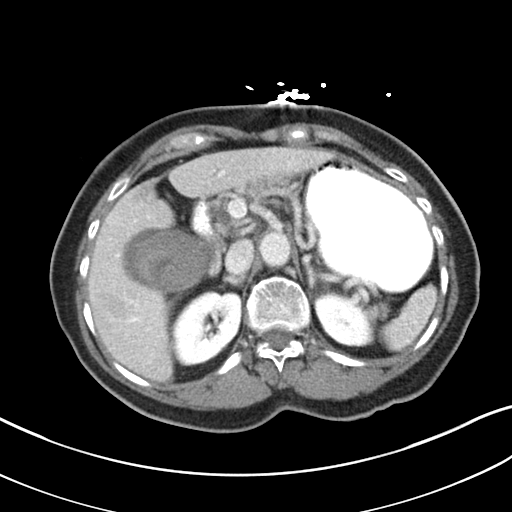
[im 70/84  soft-tissue]
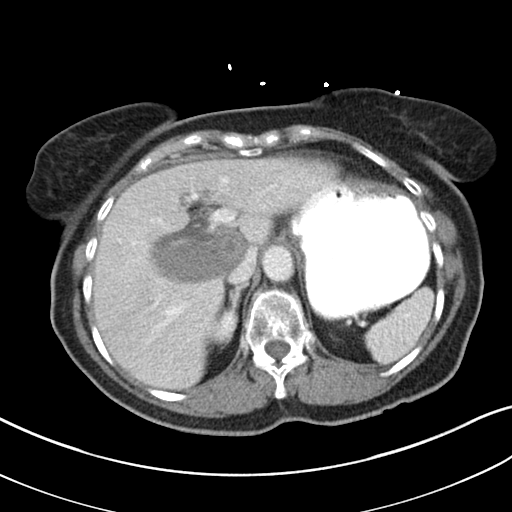
[im 79/84  soft-tissue]
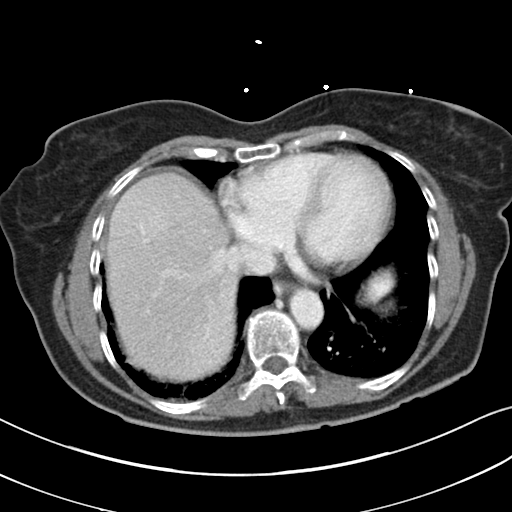

[Series 5: coronal a/|p · coronal · 0.79mm/px · 3 of 86 slices shown]
[im 29/86  soft-tissue]
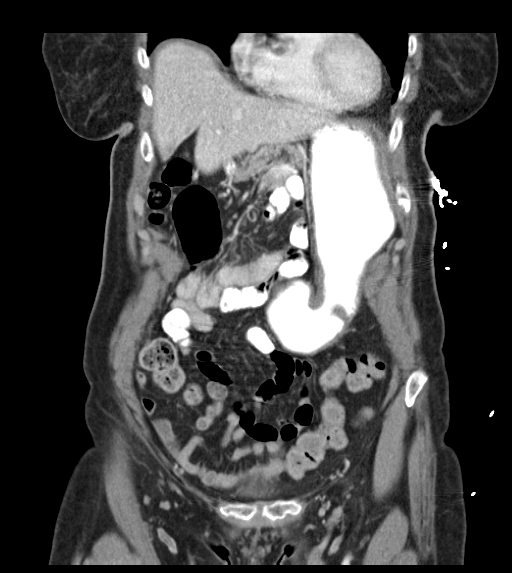
[im 38/86  soft-tissue]
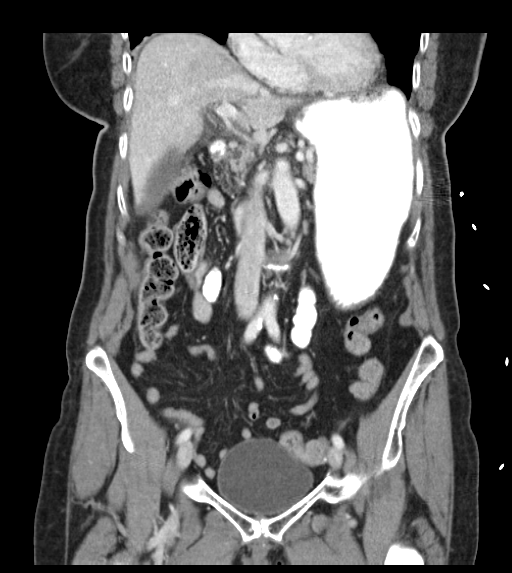
[im 48/86  soft-tissue]
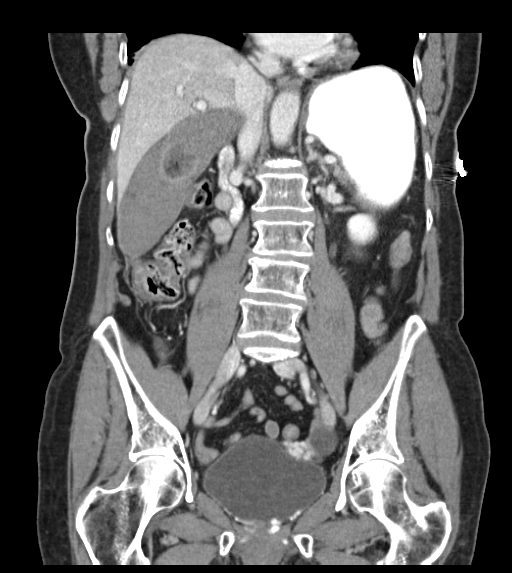

[16 of 46 positions shown; findings below may reference images not displayed]

FINDINGS: Moderate dependent airspace disease is present bilaterally. The
heart size is normal.

A small amount of fluid can be seen just below the hemidiaphragm and
surrounding the liver. Mild periportal edema is present in the
liver. Spleen demonstrate scattered calcifications. No acute
abnormality is present. The stomach is moderately distended. The
duodenum and pancreas are within normal limits.

The common bile duct is enlarged, measuring up to 11 mm. At least 2
large gallstones are present. The larger stone measures up to
cm. Smaller stone measures 1.4 cm. Increased density material within
the gallbladder likely represents sludge. There is some inflammatory
change about the gallbladder.

The adrenal glands are normal bilaterally. The kidneys and ureters
are within normal limits. The urinary bladder is unremarkable.

A small amount of free fluid within the anatomic pelvis is likely
physiologic. Rectosigmoid colon is normal. The more proximal colon
aids unremarkable. The appendix is visualized and within normal
limits. The distal small bowel is mostly collapsed. The more
proximal spot bowel is relatively collapsed as well. Atherosclerotic
changes are noted in the distal aorta and branch vessels without
aneurysm. Uterus is normal for age. Venous congestion is evident
bilaterally. Cystic changes are noted in the adnexa bilaterally,
measuring 2.6 by 2.3 cm on the right. A left-sided adnexal cyst
measures 3.2 x 2.4 cm.

Bone windows demonstrate multilevel facet degenerative changes, left
greater than right.
IMPRESSION: 1. Marked distention increased density of contents in the
gallbladder with a least 2 large stones. This raises concern for
cholecystitis. Right upper quadrant ultrasound could be used for
further evaluation if clinically indicated.
2. A small amount of free fluid is present about the liver and
layering dependently within the pelvis, concerning for an
inflammatory process.
3. Bilateral adnexal cysts, a typical for age. The largest is on the
left measuring 3.2 cm.
4. Moderate dependent airspace bilaterally. While this may represent
atelectasis, early infection is also considered.
5. Mild periportal edema within the liver.

## 2017-02-02 DIAGNOSIS — K145 Plicated tongue: Secondary | ICD-10-CM | POA: Diagnosis not present

## 2017-02-06 DIAGNOSIS — R69 Illness, unspecified: Secondary | ICD-10-CM | POA: Diagnosis not present

## 2017-03-10 DIAGNOSIS — Z1231 Encounter for screening mammogram for malignant neoplasm of breast: Secondary | ICD-10-CM | POA: Diagnosis not present

## 2017-03-10 DIAGNOSIS — M810 Age-related osteoporosis without current pathological fracture: Secondary | ICD-10-CM | POA: Diagnosis not present

## 2017-04-06 DIAGNOSIS — I7300 Raynaud's syndrome without gangrene: Secondary | ICD-10-CM | POA: Diagnosis not present

## 2017-04-06 DIAGNOSIS — M810 Age-related osteoporosis without current pathological fracture: Secondary | ICD-10-CM | POA: Diagnosis not present

## 2017-04-06 DIAGNOSIS — Z6821 Body mass index (BMI) 21.0-21.9, adult: Secondary | ICD-10-CM | POA: Diagnosis not present

## 2017-04-06 DIAGNOSIS — R768 Other specified abnormal immunological findings in serum: Secondary | ICD-10-CM | POA: Diagnosis not present

## 2017-05-11 ENCOUNTER — Other Ambulatory Visit: Payer: Self-pay | Admitting: Family Medicine

## 2017-05-11 ENCOUNTER — Ambulatory Visit
Admission: RE | Admit: 2017-05-11 | Discharge: 2017-05-11 | Disposition: A | Discharge status: Home / Self Care | Payer: Medicare HMO | Source: Ambulatory Visit | Attending: Family Medicine | Admitting: Family Medicine

## 2017-05-11 DIAGNOSIS — I10 Essential (primary) hypertension: Secondary | ICD-10-CM | POA: Diagnosis not present

## 2017-05-11 DIAGNOSIS — M810 Age-related osteoporosis without current pathological fracture: Secondary | ICD-10-CM | POA: Diagnosis not present

## 2017-05-11 DIAGNOSIS — R0982 Postnasal drip: Secondary | ICD-10-CM | POA: Diagnosis not present

## 2017-05-11 DIAGNOSIS — R109 Unspecified abdominal pain: Secondary | ICD-10-CM | POA: Diagnosis not present

## 2017-05-11 DIAGNOSIS — M546 Pain in thoracic spine: Secondary | ICD-10-CM

## 2017-05-11 DIAGNOSIS — R2689 Other abnormalities of gait and mobility: Secondary | ICD-10-CM | POA: Diagnosis not present

## 2017-05-30 ENCOUNTER — Telehealth: Payer: Self-pay | Admitting: Internal Medicine

## 2017-05-30 NOTE — Telephone Encounter (Signed)
Called pts phone - no answer and no way to leave msg . Called Son's phone - left message with appt date and time. Also sent reminder letter in the mail.

## 2017-06-02 DIAGNOSIS — M546 Pain in thoracic spine: Secondary | ICD-10-CM | POA: Diagnosis not present

## 2017-06-02 DIAGNOSIS — M810 Age-related osteoporosis without current pathological fracture: Secondary | ICD-10-CM | POA: Diagnosis not present

## 2017-06-06 ENCOUNTER — Other Ambulatory Visit: Payer: Self-pay | Admitting: Medical Oncology

## 2017-06-06 DIAGNOSIS — D473 Essential (hemorrhagic) thrombocythemia: Secondary | ICD-10-CM

## 2017-06-07 ENCOUNTER — Ambulatory Visit: Payer: Medicare HMO | Admitting: Internal Medicine

## 2017-06-07 ENCOUNTER — Telehealth: Payer: Self-pay | Admitting: Oncology

## 2017-06-07 ENCOUNTER — Other Ambulatory Visit: Payer: Medicare HMO

## 2017-06-07 NOTE — Telephone Encounter (Signed)
Called patient regarding 2/18 °

## 2017-06-19 ENCOUNTER — Inpatient Hospital Stay: Payer: Medicare HMO | Attending: Hematology and Oncology

## 2017-06-19 ENCOUNTER — Encounter: Payer: Self-pay | Admitting: Oncology

## 2017-06-19 ENCOUNTER — Inpatient Hospital Stay (HOSPITAL_BASED_OUTPATIENT_CLINIC_OR_DEPARTMENT_OTHER): Payer: Medicare HMO | Admitting: Oncology

## 2017-06-19 ENCOUNTER — Other Ambulatory Visit: Payer: Self-pay | Admitting: Oncology

## 2017-06-19 ENCOUNTER — Telehealth: Payer: Self-pay

## 2017-06-19 VITALS — BP 182/80 | HR 86 | Temp 98.6°F | Resp 18 | Ht 65.5 in | Wt 134.0 lb

## 2017-06-19 DIAGNOSIS — D473 Essential (hemorrhagic) thrombocythemia: Secondary | ICD-10-CM | POA: Insufficient documentation

## 2017-06-19 LAB — CMP (CANCER CENTER ONLY)
ALT: 12 U/L (ref 0–55)
ANION GAP: 7 (ref 3–11)
AST: 21 U/L (ref 5–34)
Albumin: 3.7 g/dL (ref 3.5–5.0)
Alkaline Phosphatase: 71 U/L (ref 40–150)
BILIRUBIN TOTAL: 0.5 mg/dL (ref 0.2–1.2)
BUN: 14 mg/dL (ref 7–26)
CO2: 32 mmol/L — ABNORMAL HIGH (ref 22–29)
Calcium: 10 mg/dL (ref 8.4–10.4)
Chloride: 100 mmol/L (ref 98–109)
Creatinine: 0.96 mg/dL (ref 0.60–1.10)
GFR, EST NON AFRICAN AMERICAN: 54 mL/min — AB (ref >60)
Glucose, Bld: 82 mg/dL (ref 70–140)
POTASSIUM: 4.2 mmol/L (ref 3.5–5.1)
Sodium: 139 mmol/L (ref 136–145)
TOTAL PROTEIN: 7.3 g/dL (ref 6.4–8.3)

## 2017-06-19 LAB — CBC WITH DIFFERENTIAL (CANCER CENTER ONLY)
BASOS ABS: 0.1 10*3/uL (ref 0.0–0.1)
Basophils Relative: 2 %
EOS PCT: 2 %
Eosinophils Absolute: 0.1 10*3/uL (ref 0.0–0.5)
HEMATOCRIT: 43.2 % (ref 34.8–46.6)
Hemoglobin: 14.4 g/dL (ref 11.6–15.9)
LYMPHS PCT: 29 %
Lymphs Abs: 1.9 10*3/uL (ref 0.9–3.3)
MCH: 30.7 pg (ref 25.1–34.0)
MCHC: 33.3 g/dL (ref 31.5–36.0)
MCV: 92.1 fL (ref 79.5–101.0)
Monocytes Absolute: 0.4 10*3/uL (ref 0.1–0.9)
Monocytes Relative: 6 %
Neutro Abs: 4 10*3/uL (ref 1.5–6.5)
Neutrophils Relative %: 61 %
PLATELETS: 550 10*3/uL — AB (ref 145–400)
RBC: 4.69 MIL/uL (ref 3.70–5.45)
RDW: 14.2 % (ref 11.2–14.5)
WBC Count: 6.5 10*3/uL (ref 3.9–10.3)

## 2017-06-19 LAB — LACTATE DEHYDROGENASE: LDH: 213 U/L (ref 125–245)

## 2017-06-19 NOTE — Assessment & Plan Note (Signed)
This is a very pleasant 83 year old African-American female with essential thrombocythemia with positive JAK-2 mutation.  The patient was lost to follow-up and returns today for evaluation and repeat lab work. The patient was seen with Dr. Julien Nordmann.  Lab results reviewed with the patient and her family member which show a platelet count of 550,000.  Explained to the patient and family member that this places her at higher risk for stroke.  Recommend that she consider proceeding with treatment with hydroxyurea 500 mg daily.  Adverse effects were discussed with the patient which include a drop in her blood count, hair thinning, and possible nausea or vomiting.  The patient seems hesitant to take the medication.  A prescription has been sent to her pharmacy. If the patient chooses not to proceed with medication, then we will continue observation. The patient will return for routine follow-up visit in 3 months with a CBC, CMET, and LDH on that date.  She was advised to call immediately if she has any concerning symptoms in the interval. The patient voices understanding of current disease status and treatment options and is in agreement with the current care plan.  All questions were answered. The patient knows to call the clinic with any problems, questions or concerns. We can certainly see the patient much sooner if necessary.

## 2017-06-19 NOTE — Telephone Encounter (Signed)
Printed avs and calender for upcoming appointments. Per 2/18 los

## 2017-06-19 NOTE — Progress Notes (Signed)
[facility] OFFICE PROGRESS NOTE  Valerie Stains, MD [place] [address]  DIAGNOSIS: Essential thrombocythemia with positive JAK 2 mutation diagnosed in July 2016.   PRIOR THERAPY: None  CURRENT THERAPY: Observation  INTERVAL HISTORY: Valerie Lopez 83 y.o. female returns for routine follow-up visit accompanied by her family number.  Patient has not been seen in our office since August 2016.  She was referred back by her primary care provider due to thrombocytosis.  Review of the records show that the patient was recommended to start hydroxyurea, but did not start it due to concern over side effects.  She was therefore on observation only.  The patient has no specific complaints today except for some discomfort in her hip when she walks.  She reports that her recent chest x-ray showed that she has osteoporosis.  She is supposed to be on Fosamax by her primary care provider, but she has not started this.  She denies fevers and chills.  Denies chest pain, shortness breath, cough, hemoptysis.  Denies nausea, vomiting, constipation, diarrhea.  She has not had any significant weight loss or night sweats.  The patient is here for evaluation and repeat lab work.  MEDICAL HISTORY: Past Medical History:  Diagnosis Date  . Dyslipidemia   . Hypertension   . Osteoporosis   . TIA (transient ischemic attack)   . Trigeminal neuralgia   . UTI (lower urinary tract infection)     ALLERGIES:  is allergic to zocor [simvastatin].  MEDICATIONS:  Current Outpatient Medications  Medication Sig Dispense Refill  . aspirin EC 81 MG tablet Take 81 mg by mouth daily.    Marland Kitchen ezetimibe (ZETIA) 10 MG tablet Take 10 mg by mouth daily.    Valerie Lopez Oil 300 MG CAPS Take 1 capsule by mouth daily.    Marland Kitchen lisinopril-hydrochlorothiazide (PRINZIDE,ZESTORETIC) 20-12.5 MG per tablet Take 1 tablet by mouth daily.     No current facility-administered medications for this visit.      SURGICAL HISTORY:  Past Surgical History:  Procedure Laterality Date  . CHOLECYSTECTOMY N/A 10/10/2014   Procedure: LAPAROSCOPIC CHOLECYSTECTOMY WITH INTRAOPERATIVE CHOLANGIOGRAM;  Surgeon: Armandina Gemma, MD;  Location: [facility];  Service: General;  Laterality: N/A;  . TUBAL LIGATION      REVIEW OF SYSTEMS:   Review of Systems  Constitutional: Negative for appetite change, chills, fatigue, fever and unexpected weight change.  HENT:   Negative for mouth sores, nosebleeds, sore throat and trouble swallowing.   Eyes: Negative for eye problems and icterus.  Respiratory: Negative for cough, hemoptysis, shortness of breath and wheezing.   Cardiovascular: Negative for chest pain and leg swelling.  Gastrointestinal: Negative for abdominal pain, constipation, diarrhea, nausea and vomiting.  Genitourinary: Negative for bladder incontinence, difficulty urinating, dysuria, frequency and hematuria.   Musculoskeletal: Negative for back pain, gait problem, neck pain and neck stiffness. Positive hip pain. Skin: Negative for itching and rash.  Neurological: Negative for dizziness, extremity weakness, gait problem, headaches, light-headedness and seizures.  Hematological: Negative for adenopathy. Does not bruise/bleed easily.  Psychiatric/Behavioral: Negative for confusion, depression and sleep disturbance. The patient is not nervous/anxious.     PHYSICAL EXAMINATION:  Blood pressure (!) 182/80, pulse 86, temperature 98.6 F (37 C), temperature source Oral, resp. rate 18, height 5' 5.5" (1.664 m), weight 134 lb (60.8 kg), SpO2 94 %.  ECOG PERFORMANCE STATUS: 1 - Symptomatic but completely ambulatory  Physical Exam  Constitutional: Oriented to person, place, and time  and well-developed, well-nourished, and in no distress. No distress.  HENT:  Head: Normocephalic and atraumatic.  Mouth/Throat: Oropharynx is clear and moist. No oropharyngeal exudate.  Eyes: Conjunctivae are normal. Right eye exhibits  no discharge. Left eye exhibits no discharge. No scleral icterus.  Neck: Normal range of motion. Neck supple.  Cardiovascular: Normal rate, regular rhythm, normal heart sounds and intact distal pulses.   Pulmonary/Chest: Effort normal and breath sounds normal. No respiratory distress. No wheezes. No rales.  Abdominal: Soft. Bowel sounds are normal. Exhibits no distension and no mass. There is no tenderness.  Musculoskeletal: Normal range of motion. Exhibits no edema.  Lymphadenopathy:    No cervical adenopathy.  Neurological: Alert and oriented to person, place, and time. Exhibits normal muscle tone. Gait normal. Coordination normal.  Skin: Skin is warm and dry. No rash noted. Not diaphoretic. No erythema. No pallor.  Psychiatric: Mood, memory and judgment normal.  Vitals reviewed.  LABORATORY DATA: Lab Results  Component Value Date   WBC 6.5 06/19/2017   HGB 13.5 12/08/2014   HCT 43.2 06/19/2017   MCV 92.1 06/19/2017   PLT 550 (H) 06/19/2017      Chemistry      Component Value Date/Time   NA 139 06/19/2017 1143   NA 143 12/08/2014 1408   K 4.2 06/19/2017 1143   K 3.8 12/08/2014 1408   CL 100 06/19/2017 1143   CO2 32 (H) 06/19/2017 1143   CO2 32 (H) 12/08/2014 1408   BUN 14 06/19/2017 1143   BUN 14.4 12/08/2014 1408   CREATININE 0.96 06/19/2017 1143   CREATININE 1.0 12/08/2014 1408      Component Value Date/Time   CALCIUM 10.0 06/19/2017 1143   CALCIUM 9.6 12/08/2014 1408   ALKPHOS 71 06/19/2017 1143   ALKPHOS 69 12/08/2014 1408   AST 21 06/19/2017 1143   AST 18 12/08/2014 1408   ALT 12 06/19/2017 1143   ALT 10 12/08/2014 1408   BILITOT 0.5 06/19/2017 1143   BILITOT 0.40 12/08/2014 1408       RADIOGRAPHIC STUDIES:  No results found.   ASSESSMENT/PLAN:  ET (essential thrombocythemia) This is a very pleasant 83 year old African-American female with essential thrombocythemia with positive JAK-2 mutation.  The patient was lost to follow-up and returns today  for evaluation and repeat lab work. The patient was seen with Dr. Julien Nordmann.  Lab results reviewed with the patient and her family member which show a platelet count of 550,000.  Explained to the patient and family member that this places her at higher risk for stroke.  Recommend that she consider proceeding with treatment with hydroxyurea 500 mg daily.  Adverse effects were discussed with the patient which include a drop in her blood count, hair thinning, and possible nausea or vomiting.  The patient seems hesitant to take the medication.  A prescription has been sent to her pharmacy. If the patient chooses not to proceed with medication, then we will continue observation. The patient will return for routine follow-up visit in 3 months with a CBC, CMET, and LDH on that date.  She was advised to call immediately if she has any concerning symptoms in the interval. The patient voices understanding of current disease status and treatment options and is in agreement with the current care plan.  All questions were answered. The patient knows to call the clinic with any problems, questions or concerns. We can certainly see the patient much sooner if necessary.  Orders Placed This Encounter  Procedures  .  CBC with Differential (Cancer Center Only)    Standing Status:   Future    Standing Expiration Date:   06/19/2018  . CMP ([facility] only)    Standing Status:   Future    Standing Expiration Date:   06/19/2018  . Lactate dehydrogenase    Standing Status:   Future    Standing Expiration Date:   06/19/2018   Valerie Bussing, Valerie Lopez, Valerie Lopez, Valerie Lopez 06/19/17  ADDENDUM: Hematology/Oncology Attending: I had a face-to-face encounter with the patient.  I recommended her care plan.  This is a very pleasant 83 years old African-American female with essential thrombocythemia.  The patient was seen a few years ago for initial evaluation and it was recommended for her at that time treatment with hydroxyurea.   She declined that treatment and missed several appointments for follow-up visit. She presented again today for evaluation based on recommendation of her primary care physician.  Repeat CBC showed elevated platelets count of 550,000 I had a lengthy discussion again with the patient and her daughter about her current condition and treatment options.  I recommended for the patient again treatment with hydroxyurea starting at a dose of 500 mg p.o. daily.  I discussed with the patient the adverse effect of this treatment including myelosuppression, alopecia, liver or renal dysfunction. She would like to take some time to think about her option again.  She will let us know of her decision in the next few days. For hypertension she was advised to monitor her blood pressure closely and to discuss with her primary care physician for adjustment of her medication. We will see her back for follow-up visit in 3 months for reevaluation. She was advised to call immediately if she has any concerning symptoms in the interval.  Disclaimer: This note was dictated with voice recognition software. Similar sounding words can inadvertently be transcribed and may be missed upon review. Valerie Kempf, MD 06/21/17

## 2017-06-19 NOTE — Patient Instructions (Signed)
Hydroxyurea capsules What is this medicine? HYDROXYUREA (hye drox ee yoor EE a) is a chemotherapy drug. This medicine is used to treat certain types of leukemias and head and neck cancer. It is also used to control the painful crises of sickle cell anemia. This medicine may be used for other purposes; ask your health care provider or pharmacist if you have questions. COMMON BRAND NAME(S): Droxia, Hydrea What should I tell my health care provider before I take this medicine? They need to know if you have any of these conditions: -gout or high levels of uric acid in the blood -HIV or AIDS -kidney disease or on hemodialysis -leg wounds or ulcers -low blood counts, like low white cell, platelet, or red cell counts -prior or current interferon therapy -recent or ongoing radiation therapy -scheduled to receive a vaccine -an unusual or allergic reaction to hydroxyurea, other medicines, foods, dyes, or preservatives -pregnant or trying to get pregnant -breast-feeding How should I use this medicine? Take this medicine by mouth with a glass of water. Follow the directions on the prescription label. Take your medicine at regular intervals. Do not take it more often than directed. Do not stop taking except on your doctor's advice. People who are not taking this medicine should not be exposed to it. Wash your hands before and after handling your bottle or medicine. Caregivers should wear disposable gloves if they must touch the bottle or medicine. Clean up any medicine powder that spills with a damp disposable towel and throw the towel away in a closed container, such as a plastic bag. A special MedGuide will be given to you by the pharmacist with each prescription and refill of Droxyia. Be sure to read this information carefully each time. Talk to your pediatrician regarding the use of this medicine in children. Special care may be needed. Overdosage: If you think you have taken too much of this medicine  contact a poison control center or emergency room at once. NOTE: This medicine is only for you. Do not share this medicine with others. What if I miss a dose? If you miss a dose, take it as soon as you can. If it is almost time for your next dose, take only that dose. Do not take double or extra doses. What may interact with this medicine? This medicine may also interact with the following medications: -didanosine -stavudine -live virus vaccines This list may not describe all possible interactions. Give your health care provider a list of all the medicines, herbs, non-prescription drugs, or dietary supplements you use. Also tell them if you smoke, drink alcohol, or use illegal drugs. Some items may interact with your medicine. What should I watch for while using this medicine? This drug may make you feel generally unwell. This is not uncommon, as chemotherapy can affect healthy cells as well as cancer cells. Report any side effects. Continue your course of treatment even though you feel ill unless your doctor tells you to stop. You will receive regular blood tests during your treatment. Call your doctor or health care professional for advice if you get a fever, chills or sore throat, or other symptoms of a cold or flu. Do not treat yourself. This drug decreases your body's ability to fight infections. Try to avoid being around people who are sick. This medicine may increase your risk to bruise or bleed. Call your doctor or health care professional if you notice any unusual bleeding. Talk to your doctor about your risk of cancer. You may  be more at risk for certain types of cancers if you take this medicine. Keep out of the sun. If you cannot avoid being in the sun, wear protective clothing and use sunscreen. Do not use sun lamps or tanning beds/booths. Do not become pregnant while taking this medicine or for at least 6 months after stopping it. Women should inform their doctor if they wish to become  pregnant or think they might be pregnant. Men should not father a child while taking this medicine and for at least a year after stopping it. There is a potential for serious side effects to an unborn child. Talk to your health care professional or pharmacist for more information. Do not breast-feed an infant while taking this medicine. This may interfere with the ability to have or father a child. You should talk with your doctor or health care professional if you are concerned about your fertility. What side effects may I notice from receiving this medicine? Side effects that you should report to your doctor or health care professional as soon as possible: -allergic reactions like skin rash, itching or hives, swelling of the face, lips, or tongue -breathing problems -burning, redness or pain at the site of any radiation therapy -low blood counts - this medicine may decrease the number of white blood cells, red blood cells and platelets. You may be at increased risk for infections and bleeding. -signs of decreased platelets or bleeding - bruising, pinpoint red spots on the skin, black, tarry stools, blood in the urine -signs of decreased red blood cells - unusually weak or tired, fainting spells, lightheadedness -signs of infection - fever or chills, cough, sore throat, pain or difficulty passing urine -signs and symptoms of bleeding such as bloody or black, tarry stools; red or dark-brown urine; spitting up blood or brown material that looks like coffee grounds; red spots on the skin; unusual bruising or bleeding from the eye, gums, or nose -skin ulcers Side effects that usually do not require medical attention (report to your doctor or health care professional if they continue or are bothersome): -constipation -diarrhea -loss of appetite -mouth sores -nausea This list may not describe all possible side effects. Call your doctor for medical advice about side effects. You may report side effects  to FDA at 1-800-FDA-1088. Where should I keep my medicine? Keep out of the reach of children. See product for storage instructions. Each product may have different instructions. Keep tightly closed. Throw away any unused medicine after the expiration date. NOTE: This sheet is a summary. It may not cover all possible information. If you have questions about this medicine, talk to your doctor, pharmacist, or health care provider.  2018 Elsevier/Gold Standard (2016-04-21 11:43:13)

## 2017-07-04 DIAGNOSIS — E7800 Pure hypercholesterolemia, unspecified: Secondary | ICD-10-CM | POA: Diagnosis not present

## 2017-07-04 DIAGNOSIS — R202 Paresthesia of skin: Secondary | ICD-10-CM | POA: Diagnosis not present

## 2017-07-04 DIAGNOSIS — D473 Essential (hemorrhagic) thrombocythemia: Secondary | ICD-10-CM | POA: Diagnosis not present

## 2017-07-04 DIAGNOSIS — R7309 Other abnormal glucose: Secondary | ICD-10-CM | POA: Diagnosis not present

## 2017-07-06 DIAGNOSIS — M810 Age-related osteoporosis without current pathological fracture: Secondary | ICD-10-CM | POA: Diagnosis not present

## 2017-07-06 DIAGNOSIS — M546 Pain in thoracic spine: Secondary | ICD-10-CM | POA: Diagnosis not present

## 2017-09-18 ENCOUNTER — Telehealth: Payer: Self-pay | Admitting: Internal Medicine

## 2017-09-18 ENCOUNTER — Inpatient Hospital Stay: Payer: Medicare HMO | Attending: Internal Medicine | Admitting: Internal Medicine

## 2017-09-18 ENCOUNTER — Inpatient Hospital Stay: Payer: Medicare HMO

## 2017-09-18 ENCOUNTER — Encounter: Payer: Self-pay | Admitting: Internal Medicine

## 2017-09-18 VITALS — BP 168/79 | HR 73 | Temp 98.8°F | Resp 18 | Ht 65.5 in | Wt 130.5 lb

## 2017-09-18 DIAGNOSIS — D473 Essential (hemorrhagic) thrombocythemia: Secondary | ICD-10-CM | POA: Insufficient documentation

## 2017-09-18 DIAGNOSIS — Z5111 Encounter for antineoplastic chemotherapy: Secondary | ICD-10-CM

## 2017-09-18 DIAGNOSIS — M79609 Pain in unspecified limb: Secondary | ICD-10-CM | POA: Diagnosis not present

## 2017-09-18 DIAGNOSIS — Z7189 Other specified counseling: Secondary | ICD-10-CM | POA: Insufficient documentation

## 2017-09-18 DIAGNOSIS — I10 Essential (primary) hypertension: Secondary | ICD-10-CM | POA: Insufficient documentation

## 2017-09-18 LAB — CBC WITH DIFFERENTIAL (CANCER CENTER ONLY)
BASOS ABS: 0.1 10*3/uL (ref 0.0–0.1)
BASOS PCT: 1 %
EOS ABS: 0.2 10*3/uL (ref 0.0–0.5)
Eosinophils Relative: 3 %
HEMATOCRIT: 44.3 % (ref 34.8–46.6)
HEMOGLOBIN: 14.6 g/dL (ref 11.6–15.9)
Lymphocytes Relative: 27 %
Lymphs Abs: 1.9 10*3/uL (ref 0.9–3.3)
MCH: 30.2 pg (ref 25.1–34.0)
MCHC: 33.1 g/dL (ref 31.5–36.0)
MCV: 91.3 fL (ref 79.5–101.0)
MONOS PCT: 8 %
Monocytes Absolute: 0.6 10*3/uL (ref 0.1–0.9)
NEUTROS ABS: 4.3 10*3/uL (ref 1.5–6.5)
NEUTROS PCT: 61 %
Platelet Count: 595 10*3/uL — ABNORMAL HIGH (ref 145–400)
RBC: 4.85 MIL/uL (ref 3.70–5.45)
RDW: 14.4 % (ref 11.2–14.5)
WBC: 7.1 10*3/uL (ref 3.9–10.3)

## 2017-09-18 LAB — CMP (CANCER CENTER ONLY)
ALBUMIN: 3.9 g/dL (ref 3.5–5.0)
ALK PHOS: 75 U/L (ref 40–150)
ALT: 16 U/L (ref 0–55)
AST: 23 U/L (ref 5–34)
Anion gap: 7 (ref 3–11)
BUN: 16 mg/dL (ref 7–26)
CALCIUM: 9.6 mg/dL (ref 8.4–10.4)
CHLORIDE: 102 mmol/L (ref 98–109)
CO2: 30 mmol/L — ABNORMAL HIGH (ref 22–29)
CREATININE: 1.01 mg/dL (ref 0.60–1.10)
GFR, Est AFR Am: 59 mL/min — ABNORMAL LOW (ref >60)
GFR, Estimated: 51 mL/min — ABNORMAL LOW (ref >60)
GLUCOSE: 64 mg/dL — AB (ref 70–140)
Potassium: 3.9 mmol/L (ref 3.5–5.1)
SODIUM: 139 mmol/L (ref 136–145)
Total Bilirubin: 0.5 mg/dL (ref 0.2–1.2)
Total Protein: 7.5 g/dL (ref 6.4–8.3)

## 2017-09-18 LAB — LACTATE DEHYDROGENASE: LDH: 213 U/L (ref 125–245)

## 2017-09-18 MED ORDER — HYDROXYUREA 500 MG PO CAPS: 500.0000 mg | ORAL_CAPSULE | Freq: Every day | ORAL | 2 refills | Status: AC

## 2017-09-18 NOTE — Progress Notes (Signed)
[facility] [phone]   Fax:([phone]) [place], MD [place] [address]  DIAGNOSIS: Essential thrombocythemia with positive JAK 2 mutation diagnosed in July 2016.   PRIOR THERAPY: None  CURRENT THERAPY: Hydroxyurea 500 mg p.o. daily.  Expected to start in the next few days.  INTERVAL HISTORY: Valerie Lopez 83 y.o. female returns to the clinic today for follow-up visit accompanied by her son.  The patient is feeling fine today with no specific complaints except for pain in the lower extremities.  She denied having any chest pain, shortness of breath, cough or hemoptysis.  She denied having any fever or chills.  She has no nausea, vomiting, diarrhea or constipation.  The patient is here today for evaluation and repeat blood work.   MEDICAL HISTORY: Past Medical History:  Diagnosis Date  . Dyslipidemia   . Hypertension   . Osteoporosis   . TIA (transient ischemic attack)   . Trigeminal neuralgia   . UTI (lower urinary tract infection)     ALLERGIES:  is allergic to zocor [simvastatin].  MEDICATIONS:  Current Outpatient Medications  Medication Sig Dispense Refill  . aspirin EC 81 MG tablet Take 81 mg by mouth daily.    Marland Kitchen ezetimibe (ZETIA) 10 MG tablet Take 10 mg by mouth daily.    Javier Docker Oil 300 MG CAPS Take 1 capsule by mouth daily.    Marland Kitchen lisinopril-hydrochlorothiazide (PRINZIDE,ZESTORETIC) 20-12.5 MG per tablet Take 1 tablet by mouth daily.     No current facility-administered medications for this visit.     SURGICAL HISTORY:  Past Surgical History:  Procedure Laterality Date  . CHOLECYSTECTOMY N/A 10/10/2014   Procedure: LAPAROSCOPIC CHOLECYSTECTOMY WITH INTRAOPERATIVE CHOLANGIOGRAM;  Surgeon: Armandina Gemma, MD;  Location: [facility];  Service: General;  Laterality: N/A;  . TUBAL LIGATION      REVIEW OF SYSTEMS:  A comprehensive review of systems was negative except for:  Constitutional: positive for fatigue Musculoskeletal: positive for arthralgias   PHYSICAL EXAMINATION: General appearance: alert, cooperative and no distress Head: Normocephalic, without obvious abnormality, atraumatic Neck: no adenopathy, no JVD, supple, symmetrical, trachea midline and thyroid not enlarged, symmetric, no tenderness/mass/nodules Lymph nodes: Cervical, supraclavicular, and axillary nodes normal. Resp: clear to auscultation bilaterally Back: symmetric, no curvature. ROM normal. No CVA tenderness. Cardio: regular rate and rhythm, S1, S2 normal, no murmur, click, rub or gallop GI: soft, non-tender; bowel sounds normal; no masses,  no organomegaly Extremities: extremities normal, atraumatic, no cyanosis or edema  ECOG PERFORMANCE STATUS: 1 - Symptomatic but completely ambulatory  Blood pressure (!) 168/84, pulse 73, temperature 98.8 F (37.1 C), temperature source Oral, resp. rate 18, height 5' 5.5" (1.664 m), weight 130 lb 8 oz (59.2 kg), SpO2 98 %.  LABORATORY DATA: Lab Results  Component Value Date   WBC 7.1 09/18/2017   HGB 14.6 09/18/2017   HCT 44.3 09/18/2017   MCV 91.3 09/18/2017   PLT 595 (H) 09/18/2017      Chemistry      Component Value Date/Time   NA 139 06/19/2017 1143   NA 143 12/08/2014 1408   K 4.2 06/19/2017 1143   K 3.8 12/08/2014 1408   CL 100 06/19/2017 1143   CO2 32 (H) 06/19/2017 1143   CO2 32 (H) 12/08/2014 1408   BUN 14 06/19/2017 1143   BUN 14.4 12/08/2014 1408   CREATININE 0.96 06/19/2017 1143   CREATININE 1.0 12/08/2014  1408      Component Value Date/Time   CALCIUM 10.0 06/19/2017 1143   CALCIUM 9.6 12/08/2014 1408   ALKPHOS 71 06/19/2017 1143   ALKPHOS 69 12/08/2014 1408   AST 21 06/19/2017 1143   AST 18 12/08/2014 1408   ALT 12 06/19/2017 1143   ALT 10 12/08/2014 1408   BILITOT 0.5 06/19/2017 1143   BILITOT 0.40 12/08/2014 1408       RADIOGRAPHIC STUDIES: No results found.  ASSESSMENT AND PLAN: This is a very  pleasant 83 years old white female recently diagnosed with essential thrombocythemia with positive JAK-2 mutation.  She has been in observation because she declined treatment in the past. Her recent CBC showed further increase in her platelets count up to 595,000. I discussed the lab results with the patient and her son. I also discussed with the patient the option of treatment with hydroxyurea 500 mg p.o. daily versus close monitoring and observation.  She is now interested in treatment and they will give her prescription for hydroxyurea.  She is expected to start this treatment in the next few days. She is traveling to [facility] for several weeks and she will be back the first week of July.  I will arrange for her to have a follow-up appointment at that time with repeat blood work. For the hypertension, I strongly encouraged the patient to take her blood pressure medication as prescribed and to discuss with her primary care physician adjustment of her medications if needed. She was advised to call immediately if she has any concerning symptoms in the interval. The patient voices understanding of current disease status and treatment options and is in agreement with the current care plan.  All questions were answered. The patient knows to call the clinic with any problems, questions or concerns. We can certainly see the patient much sooner if necessary.   Disclaimer: This note was dictated with voice recognition software. Similar sounding words can inadvertently be transcribed and may not be corrected upon review.

## 2017-09-18 NOTE — Patient Instructions (Signed)
Essential Thrombocythemia Essential thrombocythemia is a condition in which a person has too many platelets (thrombocytes) in the blood. Platelets are parts of blood that stick together and form a clot (thrombus) to help the body stop bleeding after an injury. This condition may also be called primary or essential thrombocytosis. Essential thrombocythemia happens when abnormal cells in the bone marrow (megakaryocytes) make too many platelets. What are the causes? The cause of this condition is not known. What are the signs or symptoms? This condition may not cause any symptoms. If you have symptoms, they may include:  Weakness.  Headache.  Itching.  Sweating.  Fever.  Dizziness or confusion.  Tingling or burning in your hands or feet.  Blood clots.  Bleeding.  Enlarged spleen.  How is this diagnosed? This condition may be diagnosed based on:  A physical exam.  Your symptoms.  Your medical history.  Blood tests.  A procedure to collect a sample of your bone marrow (bone marrow aspiration) for testing.  How is this treated? If you do not have symptoms, you may not need treatment. Your health care provider may monitor your condition with regular blood tests. If you have symptoms, or if your platelet count is very high, you may be treated with:  Aspirin or other medicines to thin the blood and prevent blood clots.  Medicines to reduce the number of platelets in your blood.  A procedure to remove some platelets from your blood (plateletpheresis). During this procedure: ? Your health care provider will place an IV into one of your veins. ? The IV will be used to draw blood into a machine that separates out the extra platelets. ? The blood with reduced platelets will be returned to your body.  Follow these instructions at home:  Take over-the-counter and prescription medicines only as told by your health care provider.  If you are taking blood thinners: ? Talk with  your health care provider before you take any medicines that contain aspirin or NSAIDs. These medicines increase your risk for dangerous bleeding. ? Take your medicine exactly as told, at the same time every day. ? Avoid activities that could cause injury or bruising, and follow instructions about how to prevent falls. ? Wear a medical alert bracelet or carry a card that lists what medicines you take.  Tell all health care providers, including dentists, about any medicines you are taking to prevent blood clots.  Do not use any products that contain nicotine or tobacco, such as cigarettes and e-cigarettes. If you need help quitting, ask your health care provider.  Ask your health care provider about managing or preventing high cholesterol, high blood pressure, and diabetes. These conditions can make essential thrombocythemia worse.  Keep all follow-up visits as told by your health care provider. This is important. Contact a health care provider if:  You have severe pain, and medicines do not help.  You have problems taking your medicines to prevent blood clots.  You faint. Get help right away if:  You have bleeding or blood clots.  You have unusual bruises.  You have bloody or tarry stools.  You have pink or bloody urine.  Your menstrual periods are heavier than normal, if applicable.  You have nosebleeds and bleeding gums.  You have chest pain.  You have trouble breathing.  You have any symptoms of a stroke. "BE FAST" is an easy way to remember the main warning signs of a stroke: ? B - Balance. Signs are dizziness, sudden trouble   walking, or loss of balance. ? E - Eyes. Signs are trouble seeing or a sudden change in vision. ? F - Face. Signs are sudden weakness or numbness of the face, or the face or eyelid drooping on one side. ? A - Arm. Signs are weakness or numbness in an arm. This happens suddenly and usually on one side of the body. ? S - Speech. Signs are sudden  trouble speaking, slurred speech, or trouble understanding what people say. ? T - Time. Time to call emergency services. Write down what time symptoms started.  You have other signs of a stroke, such as: ? A sudden, severe headache with no known cause. ? Nausea or vomiting. ? Seizure. These symptoms may represent a serious problem that is an emergency. Do not wait to see if the symptoms will go away. Get medical help right away. Call your local emergency services (911 in the [place] Do not drive yourself to the hospital. Summary  Essential thrombocythemia happens when abnormal cells in the bone marrow make too many platelets.  If you have symptoms, or if your platelet count is very high, you may need treatment.  Treatment can vary and may include medicines to thin the blood and prevent blood clots.  Ask your health care provider about how to manage or prevent high cholesterol, high blood pressure, and diabetes. These conditions can make essential thrombocythemia worse.  Get help right away if you have any symptoms of stroke. This information is not intended to replace advice given to you by your health care provider. Make sure you discuss any questions you have with your health care provider. Document Released: 09/02/2016 Document Revised: 09/02/2016 Document Reviewed: 09/02/2016 Elsevier Interactive Patient Education  2018 [employer] Hydroxyurea capsules What is this medicine? HYDROXYUREA (hye drox ee yoor EE a) is a chemotherapy drug. This medicine is used to treat certain types of leukemias and head and neck cancer. It is also used to control the painful crises of sickle cell anemia. This medicine may be used for other purposes; ask your health care provider or pharmacist if you have questions. COMMON BRAND NAME(S): Droxia, Hydrea What should I tell my health care provider before I take this medicine? They need to know if you have any of these conditions: -gout or high levels of  uric acid in the blood -HIV or AIDS -kidney disease or on hemodialysis -leg wounds or ulcers -low blood counts, like low white cell, platelet, or red cell counts -prior or current interferon therapy -recent or ongoing radiation therapy -scheduled to receive a vaccine -an unusual or allergic reaction to hydroxyurea, other medicines, foods, dyes, or preservatives -pregnant or trying to get pregnant -breast-feeding How should I use this medicine? Take this medicine by mouth with a glass of water. Follow the directions on the prescription label. Take your medicine at regular intervals. Do not take it more often than directed. Do not stop taking except on your doctor's advice. People who are not taking this medicine should not be exposed to it. Wash your hands before and after handling your bottle or medicine. Caregivers should wear disposable gloves if they must touch the bottle or medicine. Clean up any medicine powder that spills with a damp disposable towel and throw the towel away in a closed container, such as a plastic bag. A special MedGuide will be given to you by the pharmacist with each prescription and refill of Droxyia. Be sure to read this information carefully each time. Talk to your   pediatrician regarding the use of this medicine in children. Special care may be needed. Overdosage: If you think you have taken too much of this medicine contact a poison control center or emergency room at once. NOTE: This medicine is only for you. Do not share this medicine with others. What if I miss a dose? If you miss a dose, take it as soon as you can. If it is almost time for your next dose, take only that dose. Do not take double or extra doses. What may interact with this medicine? This medicine may also interact with the following medications: -didanosine -stavudine -live virus vaccines This list may not describe all possible interactions. Give your health care provider a list of all the  medicines, herbs, non-prescription drugs, or dietary supplements you use. Also tell them if you smoke, drink alcohol, or use illegal drugs. Some items may interact with your medicine. What should I watch for while using this medicine? This drug may make you feel generally unwell. This is not uncommon, as chemotherapy can affect healthy cells as well as cancer cells. Report any side effects. Continue your course of treatment even though you feel ill unless your doctor tells you to stop. You will receive regular blood tests during your treatment. Call your doctor or health care professional for advice if you get a fever, chills or sore throat, or other symptoms of a cold or flu. Do not treat yourself. This drug decreases your body's ability to fight infections. Try to avoid being around people who are sick. This medicine may increase your risk to bruise or bleed. Call your doctor or health care professional if you notice any unusual bleeding. Talk to your doctor about your risk of cancer. You may be more at risk for certain types of cancers if you take this medicine. Keep out of the sun. If you cannot avoid being in the sun, wear protective clothing and use sunscreen. Do not use sun lamps or tanning beds/booths. Do not become pregnant while taking this medicine or for at least 6 months after stopping it. Women should inform their doctor if they wish to become pregnant or think they might be pregnant. Men should not father a child while taking this medicine and for at least a year after stopping it. There is a potential for serious side effects to an unborn child. Talk to your health care professional or pharmacist for more information. Do not breast-feed an infant while taking this medicine. This may interfere with the ability to have or father a child. You should talk with your doctor or health care professional if you are concerned about your fertility. What side effects may I notice from receiving this  medicine? Side effects that you should report to your doctor or health care professional as soon as possible: -allergic reactions like skin rash, itching or hives, swelling of the face, lips, or tongue -breathing problems -burning, redness or pain at the site of any radiation therapy -low blood counts - this medicine may decrease the number of white blood cells, red blood cells and platelets. You may be at increased risk for infections and bleeding. -signs of decreased platelets or bleeding - bruising, pinpoint red spots on the skin, black, tarry stools, blood in the urine -signs of decreased red blood cells - unusually weak or tired, fainting spells, lightheadedness -signs of infection - fever or chills, cough, sore throat, pain or difficulty passing urine -signs and symptoms of bleeding such as bloody or black, tarry   stools; red or dark-brown urine; spitting up blood or brown material that looks like coffee grounds; red spots on the skin; unusual bruising or bleeding from the eye, gums, or nose -skin ulcers Side effects that usually do not require medical attention (report to your doctor or health care professional if they continue or are bothersome): -constipation -diarrhea -loss of appetite -mouth sores -nausea This list may not describe all possible side effects. Call your doctor for medical advice about side effects. You may report side effects to FDA at 1-800-FDA-1088. Where should I keep my medicine? Keep out of the reach of children. See product for storage instructions. Each product may have different instructions. Keep tightly closed. Throw away any unused medicine after the expiration date. NOTE: This sheet is a summary. It may not cover all possible information. If you have questions about this medicine, talk to your doctor, pharmacist, or health care provider.  2018 Elsevier/Gold Standard (2016-04-21 11:43:13)   

## 2017-09-18 NOTE — Telephone Encounter (Signed)
Scheduled appt per 5/20 los - per patient request schedule appt on 5/24 due to being out of town in 7 weeks - gave patient aVS and calender per los.

## 2017-09-27 DIAGNOSIS — E039 Hypothyroidism, unspecified: Secondary | ICD-10-CM | POA: Diagnosis not present

## 2017-09-27 DIAGNOSIS — G600 Hereditary motor and sensory neuropathy: Secondary | ICD-10-CM | POA: Diagnosis not present

## 2017-09-27 DIAGNOSIS — G609 Hereditary and idiopathic neuropathy, unspecified: Secondary | ICD-10-CM | POA: Diagnosis not present

## 2017-09-27 DIAGNOSIS — I7300 Raynaud's syndrome without gangrene: Secondary | ICD-10-CM | POA: Diagnosis not present

## 2017-09-27 DIAGNOSIS — M340 Progressive systemic sclerosis: Secondary | ICD-10-CM | POA: Diagnosis not present

## 2017-10-17 ENCOUNTER — Other Ambulatory Visit: Payer: Self-pay | Admitting: Medical Oncology

## 2017-10-17 ENCOUNTER — Telehealth: Payer: Self-pay | Admitting: Medical Oncology

## 2017-10-17 DIAGNOSIS — Z79899 Other long term (current) drug therapy: Secondary | ICD-10-CM | POA: Diagnosis not present

## 2017-10-17 DIAGNOSIS — R112 Nausea with vomiting, unspecified: Secondary | ICD-10-CM | POA: Diagnosis not present

## 2017-10-17 DIAGNOSIS — E871 Hypo-osmolality and hyponatremia: Secondary | ICD-10-CM | POA: Diagnosis not present

## 2017-10-17 DIAGNOSIS — T887XXA Unspecified adverse effect of drug or medicament, initial encounter: Secondary | ICD-10-CM | POA: Diagnosis not present

## 2017-10-17 DIAGNOSIS — R1110 Vomiting, unspecified: Secondary | ICD-10-CM | POA: Diagnosis not present

## 2017-10-17 DIAGNOSIS — Z7982 Long term (current) use of aspirin: Secondary | ICD-10-CM | POA: Diagnosis not present

## 2017-10-17 DIAGNOSIS — Y929 Unspecified place or not applicable: Secondary | ICD-10-CM | POA: Diagnosis not present

## 2017-10-17 DIAGNOSIS — E869 Volume depletion, unspecified: Secondary | ICD-10-CM | POA: Diagnosis not present

## 2017-10-17 DIAGNOSIS — Z888 Allergy status to other drugs, medicaments and biological substances status: Secondary | ICD-10-CM | POA: Diagnosis not present

## 2017-10-17 DIAGNOSIS — T451X5A Adverse effect of antineoplastic and immunosuppressive drugs, initial encounter: Secondary | ICD-10-CM | POA: Diagnosis not present

## 2017-10-17 DIAGNOSIS — E860 Dehydration: Secondary | ICD-10-CM | POA: Diagnosis not present

## 2017-10-17 DIAGNOSIS — D473 Essential (hemorrhagic) thrombocythemia: Secondary | ICD-10-CM

## 2017-10-17 MED ORDER — PROCHLORPERAZINE MALEATE 10 MG PO TABS: 10.0000 mg | ORAL_TABLET | Freq: Four times a day (QID) | ORAL | 0 refills | Status: DC | PRN

## 2017-10-17 NOTE — Telephone Encounter (Signed)
Compazine received by [facility] in [place].

## 2017-10-23 ENCOUNTER — Encounter: Payer: Self-pay | Admitting: Internal Medicine

## 2017-10-23 ENCOUNTER — Telehealth: Payer: Self-pay

## 2017-10-23 ENCOUNTER — Other Ambulatory Visit: Payer: Self-pay | Admitting: *Deleted

## 2017-10-23 ENCOUNTER — Inpatient Hospital Stay: Payer: Medicare HMO

## 2017-10-23 ENCOUNTER — Inpatient Hospital Stay: Payer: Medicare HMO | Attending: Internal Medicine | Admitting: Internal Medicine

## 2017-10-23 VITALS — BP 153/74 | HR 84 | Temp 98.1°F | Resp 18 | Ht 65.5 in | Wt 129.9 lb

## 2017-10-23 DIAGNOSIS — D75839 Thrombocytosis, unspecified: Secondary | ICD-10-CM

## 2017-10-23 DIAGNOSIS — I10 Essential (primary) hypertension: Secondary | ICD-10-CM

## 2017-10-23 DIAGNOSIS — D473 Essential (hemorrhagic) thrombocythemia: Secondary | ICD-10-CM | POA: Diagnosis not present

## 2017-10-23 DIAGNOSIS — Z5111 Encounter for antineoplastic chemotherapy: Secondary | ICD-10-CM

## 2017-10-23 LAB — CMP (CANCER CENTER ONLY)
ALBUMIN: 3 g/dL — AB (ref 3.5–5.0)
ALK PHOS: 78 U/L (ref 40–150)
ALT: 50 U/L (ref 0–55)
ANION GAP: 9 (ref 3–11)
AST: 46 U/L — AB (ref 5–34)
BILIRUBIN TOTAL: 0.9 mg/dL (ref 0.2–1.2)
BUN: 22 mg/dL (ref 7–26)
CALCIUM: 9.4 mg/dL (ref 8.4–10.4)
CO2: 31 mmol/L — AB (ref 22–29)
CREATININE: 1.72 mg/dL — AB (ref 0.60–1.10)
Chloride: 92 mmol/L — ABNORMAL LOW (ref 98–109)
GFR, Est AFR Am: 31 mL/min — ABNORMAL LOW (ref >60)
GFR, Estimated: 27 mL/min — ABNORMAL LOW (ref >60)
Glucose, Bld: 106 mg/dL (ref 70–140)
Potassium: 4 mmol/L (ref 3.5–5.1)
Sodium: 132 mmol/L — ABNORMAL LOW (ref 136–145)
TOTAL PROTEIN: 7.4 g/dL (ref 6.4–8.3)

## 2017-10-23 LAB — CBC WITH DIFFERENTIAL (CANCER CENTER ONLY)
BASOS PCT: 1 %
Basophils Absolute: 0.1 10*3/uL (ref 0.0–0.1)
Eosinophils Absolute: 0 10*3/uL (ref 0.0–0.5)
Eosinophils Relative: 0 %
HEMATOCRIT: 39.5 % (ref 34.8–46.6)
HEMOGLOBIN: 13.3 g/dL (ref 11.6–15.9)
Lymphocytes Relative: 13 %
Lymphs Abs: 1.4 10*3/uL (ref 0.9–3.3)
MCH: 30.9 pg (ref 25.1–34.0)
MCHC: 33.7 g/dL (ref 31.5–36.0)
MCV: 91.7 fL (ref 79.5–101.0)
MONOS PCT: 9 %
Monocytes Absolute: 1 10*3/uL — ABNORMAL HIGH (ref 0.1–0.9)
NEUTROS ABS: 8.3 10*3/uL — AB (ref 1.5–6.5)
NEUTROS PCT: 77 %
Platelet Count: 351 10*3/uL (ref 145–400)
RBC: 4.3 MIL/uL (ref 3.70–5.45)
RDW: 16.1 % — ABNORMAL HIGH (ref 11.2–14.5)
WBC: 10.8 10*3/uL — AB (ref 3.9–10.3)

## 2017-10-23 LAB — LACTATE DEHYDROGENASE: LDH: 263 U/L — ABNORMAL HIGH (ref 125–245)

## 2017-10-23 NOTE — Telephone Encounter (Signed)
Printed avs and calender of upcoming appointment. Was unable to schedule on the 24th of July due to the patient daughter wanted her mom o/v to be with [facility] only and also she could only come Monday 22nd.

## 2017-10-23 NOTE — Progress Notes (Signed)
[facility] Telephone:([phone]) [phone]   Fax:([phone]) [place], [place] [address]  DIAGNOSIS: Essential thrombocythemia with positive JAK 2 mutation diagnosed in July 2016.   PRIOR THERAPY: None  CURRENT THERAPY: Hydroxyurea 500 mg p.o. daily.  Status post 3 weeks of treatment.  This treatment has been on hold since October 15, 2017 secondary to intolerance.  INTERVAL HISTORY: Valerie Lopez 83 y.o. female returns to the clinic today for follow-up visit accompanied by her 2 daughters.  The patient was started on treatment with hydroxyurea and months ago.  The patient discontinued the treatment a week ago secondary to fatigue as well as mild nausea and one episode of vomiting.  She was seen at the emergency department in [place].  She denied having any current chest pain, shortness of breath, cough or hemoptysis.  She denied having any fever or chills.  She has no nausea, vomiting, diarrhea or constipation.  She has no weight loss or night sweats.  She is here today for evaluation and repeat blood work.   MEDICAL HISTORY: Past Medical History:  Diagnosis Date  . Dyslipidemia   . Hypertension   . Osteoporosis   . TIA (transient ischemic attack)   . Trigeminal neuralgia   . UTI (lower urinary tract infection)     ALLERGIES:  is allergic to zocor [simvastatin].  MEDICATIONS:  Current Outpatient Medications  Medication Sig Dispense Refill  . aspirin EC 81 MG tablet Take 81 mg by mouth daily.    Marland Kitchen ezetimibe (ZETIA) 10 MG tablet Take 10 mg by mouth daily.    . hydroxyurea (HYDREA) 500 MG capsule Take 1 capsule (500 mg total) by mouth daily. May take with food to minimize GI side effects. 30 capsule 2  . Krill Oil 300 MG CAPS Take 1 capsule by mouth daily.    Marland Kitchen lisinopril-hydrochlorothiazide (PRINZIDE,ZESTORETIC) 20-12.5 MG per tablet Take 1 tablet by mouth daily.    . prochlorperazine (COMPAZINE)  10 MG tablet Take 1 tablet (10 mg total) by mouth every 6 (six) hours as needed for nausea or vomiting. 30 tablet 0  . promethazine (PHENERGAN) 25 MG tablet TK 1 T PO  Q 4 H PRF NAUSEA AND VOMITING  0   No current facility-administered medications for this visit.     SURGICAL HISTORY:  Past Surgical History:  Procedure Laterality Date  . CHOLECYSTECTOMY N/A 10/10/2014   Procedure: LAPAROSCOPIC CHOLECYSTECTOMY WITH INTRAOPERATIVE CHOLANGIOGRAM;  Surgeon: Armandina Gemma, MD;  Location: [facility];  Service: General;  Laterality: N/A;  . TUBAL LIGATION      REVIEW OF SYSTEMS:  A comprehensive review of systems was negative except for: Constitutional: positive for fatigue   PHYSICAL EXAMINATION: General appearance: alert, cooperative, flushed and no distress Head: Normocephalic, without obvious abnormality, atraumatic Neck: no adenopathy, no JVD, supple, symmetrical, trachea midline and thyroid not enlarged, symmetric, no tenderness/mass/nodules Lymph nodes: Cervical, supraclavicular, and axillary nodes normal. Resp: clear to auscultation bilaterally Back: symmetric, no curvature. ROM normal. No CVA tenderness. Cardio: regular rate and rhythm, S1, S2 normal, no murmur, click, rub or gallop GI: soft, non-tender; bowel sounds normal; no masses,  no organomegaly Extremities: extremities normal, atraumatic, no cyanosis or edema  ECOG PERFORMANCE STATUS: 1 - Symptomatic but completely ambulatory  Blood pressure (!) 153/74, pulse 84, temperature 98.1 F (36.7 C), temperature source Oral, resp. rate 18, height 5' 5.5" (1.664 m), weight 129 lb 14.4  oz (58.9 kg), SpO2 98 %.  LABORATORY DATA: Lab Results  Component Value Date   WBC 10.8 (H) 10/23/2017   HGB 13.3 10/23/2017   HCT 39.5 10/23/2017   MCV 91.7 10/23/2017   PLT 351 10/23/2017      Chemistry      Component Value Date/Time   NA 139 09/18/2017 1046   NA 143 12/08/2014 1408   K 3.9 09/18/2017 1046   K 3.8 12/08/2014 1408   CL 102  09/18/2017 1046   CO2 30 (H) 09/18/2017 1046   CO2 32 (H) 12/08/2014 1408   BUN 16 09/18/2017 1046   BUN 14.4 12/08/2014 1408   CREATININE 1.01 09/18/2017 1046   CREATININE 1.0 12/08/2014 1408      Component Value Date/Time   CALCIUM 9.6 09/18/2017 1046   CALCIUM 9.6 12/08/2014 1408   ALKPHOS 75 09/18/2017 1046   ALKPHOS 69 12/08/2014 1408   AST 23 09/18/2017 1046   AST 18 12/08/2014 1408   ALT 16 09/18/2017 1046   ALT 10 12/08/2014 1408   BILITOT 0.5 09/18/2017 1046   BILITOT 0.40 12/08/2014 1408       RADIOGRAPHIC STUDIES: No results found.  ASSESSMENT AND PLAN: This is a very pleasant 83 years old white female recently diagnosed with essential thrombocythemia with positive JAK-2 mutation.  The patient was started on treatment with hydroxyurea months ago and she took it for around 3 weeks. It was discontinued a week ago secondary to increasing fatigue and weakness as well as nausea and one episode of vomiting. CBC performed earlier today showed improvement of her platelets count with mild elevation of the total white blood count.  I discussed the lab results with the patient and her daughters. I discussed with the patient resuming her treatment with taking nausea medications before her tablets versus taken hydroxyurea 500 mg every other day versus continuous observation and monitoring. The patient would like to take few days to think about her option and she has enough medication at home if she decided to resume her treatment. I will see her back for follow-up visit in 1 month for evaluation with repeat CBC, complaints metabolic panel and LDH. I will send prescription for Compazine 10 mg p.o. every 6 hours as needed for nausea to her pharmacy. She was advised to call immediately if she has any concerning symptoms in the interval. The patient voices understanding of current disease status and treatment options and is in agreement with the current care plan.  All questions were  answered. The patient knows to call the clinic with any problems, questions or concerns. We can certainly see the patient much sooner if necessary.   Disclaimer: This note was dictated with voice recognition software. Similar sounding words can inadvertently be transcribed and may not be corrected upon review.

## 2017-10-27 ENCOUNTER — Other Ambulatory Visit: Payer: Self-pay | Admitting: *Deleted

## 2017-10-27 DIAGNOSIS — D473 Essential (hemorrhagic) thrombocythemia: Secondary | ICD-10-CM

## 2017-10-27 MED ORDER — PROCHLORPERAZINE MALEATE 10 MG PO TABS: 10.0000 mg | ORAL_TABLET | Freq: Four times a day (QID) | ORAL | 0 refills | Status: AC | PRN

## 2017-10-27 NOTE — Telephone Encounter (Signed)
Daughter Elianie called regarding nausea medication. Per MD progress note compazine to be sent to pt pharmacy. Medication e-scribed to [facility].

## 2017-11-10 DIAGNOSIS — D649 Anemia, unspecified: Secondary | ICD-10-CM | POA: Diagnosis not present

## 2017-11-10 DIAGNOSIS — D473 Essential (hemorrhagic) thrombocythemia: Secondary | ICD-10-CM | POA: Diagnosis not present

## 2017-11-20 ENCOUNTER — Inpatient Hospital Stay: Payer: Medicare HMO | Attending: Internal Medicine | Admitting: Internal Medicine

## 2017-11-20 ENCOUNTER — Inpatient Hospital Stay: Payer: Medicare HMO

## 2017-11-20 ENCOUNTER — Telehealth: Payer: Self-pay | Admitting: Medical Oncology

## 2017-11-20 NOTE — Telephone Encounter (Signed)
Needs to r/s appt for today. Pt with daughter in [place]. Schedule message sent.

## 2017-11-21 ENCOUNTER — Telehealth: Payer: Self-pay | Admitting: Internal Medicine

## 2017-11-21 NOTE — Telephone Encounter (Signed)
Scheduled apt per 7/22 sch message - left message with appt date and time and sent reminder letter in the mail.

## 2017-11-27 ENCOUNTER — Telehealth: Payer: Self-pay | Admitting: Internal Medicine

## 2017-11-27 NOTE — Telephone Encounter (Signed)
Faxed medical record to [facility] at [phone], Release ID: [id number]

## 2017-12-01 DIAGNOSIS — D649 Anemia, unspecified: Secondary | ICD-10-CM | POA: Diagnosis not present

## 2017-12-01 DIAGNOSIS — D473 Essential (hemorrhagic) thrombocythemia: Secondary | ICD-10-CM | POA: Diagnosis not present

## 2017-12-14 ENCOUNTER — Other Ambulatory Visit: Payer: Self-pay | Admitting: Internal Medicine

## 2017-12-18 ENCOUNTER — Telehealth: Payer: Self-pay | Admitting: Internal Medicine

## 2017-12-18 ENCOUNTER — Encounter: Payer: Self-pay | Admitting: Internal Medicine

## 2017-12-18 ENCOUNTER — Inpatient Hospital Stay: Payer: Medicare HMO

## 2017-12-18 ENCOUNTER — Inpatient Hospital Stay: Payer: Medicare HMO | Attending: Internal Medicine | Admitting: Internal Medicine

## 2017-12-18 VITALS — BP 159/81 | HR 80 | Temp 98.0°F | Resp 16 | Ht 65.5 in | Wt 122.4 lb

## 2017-12-18 DIAGNOSIS — R202 Paresthesia of skin: Secondary | ICD-10-CM | POA: Diagnosis not present

## 2017-12-18 DIAGNOSIS — D473 Essential (hemorrhagic) thrombocythemia: Secondary | ICD-10-CM

## 2017-12-18 DIAGNOSIS — R634 Abnormal weight loss: Secondary | ICD-10-CM | POA: Diagnosis not present

## 2017-12-18 DIAGNOSIS — R7303 Prediabetes: Secondary | ICD-10-CM | POA: Diagnosis not present

## 2017-12-18 DIAGNOSIS — Z79899 Other long term (current) drug therapy: Secondary | ICD-10-CM

## 2017-12-18 DIAGNOSIS — Z5111 Encounter for antineoplastic chemotherapy: Secondary | ICD-10-CM

## 2017-12-18 DIAGNOSIS — I10 Essential (primary) hypertension: Secondary | ICD-10-CM

## 2017-12-18 DIAGNOSIS — E785 Hyperlipidemia, unspecified: Secondary | ICD-10-CM | POA: Diagnosis not present

## 2017-12-18 LAB — CMP (CANCER CENTER ONLY)
ALT: 27 U/L (ref 0–44)
AST: 24 U/L (ref 15–41)
Albumin: 3.3 g/dL — ABNORMAL LOW (ref 3.5–5.0)
Alkaline Phosphatase: 66 U/L (ref 38–126)
Anion gap: 10 (ref 5–15)
BUN: 11 mg/dL (ref 8–23)
CHLORIDE: 101 mmol/L (ref 98–111)
CO2: 31 mmol/L (ref 22–32)
CREATININE: 1.05 mg/dL — AB (ref 0.44–1.00)
Calcium: 9.9 mg/dL (ref 8.9–10.3)
GFR, EST AFRICAN AMERICAN: 57 mL/min — AB (ref >60)
GFR, EST NON AFRICAN AMERICAN: 49 mL/min — AB (ref >60)
Glucose, Bld: 113 mg/dL — ABNORMAL HIGH (ref 70–99)
POTASSIUM: 4 mmol/L (ref 3.5–5.1)
Sodium: 142 mmol/L (ref 135–145)
TOTAL PROTEIN: 7.7 g/dL (ref 6.5–8.1)
Total Bilirubin: 0.4 mg/dL (ref 0.3–1.2)

## 2017-12-18 LAB — CBC WITH DIFFERENTIAL (CANCER CENTER ONLY)
Basophils Absolute: 0.1 10*3/uL (ref 0.0–0.1)
Basophils Relative: 1 %
EOS PCT: 2 %
Eosinophils Absolute: 0.2 10*3/uL (ref 0.0–0.5)
HCT: 38.7 % (ref 34.8–46.6)
Hemoglobin: 12.4 g/dL (ref 11.6–15.9)
LYMPHS ABS: 2 10*3/uL (ref 0.9–3.3)
LYMPHS PCT: 28 %
MCH: 30.7 pg (ref 25.1–34.0)
MCHC: 32 g/dL (ref 31.5–36.0)
MCV: 95.8 fL (ref 79.5–101.0)
Monocytes Absolute: 0.5 10*3/uL (ref 0.1–0.9)
Monocytes Relative: 7 %
Neutro Abs: 4.6 10*3/uL (ref 1.5–6.5)
Neutrophils Relative %: 62 %
PLATELETS: 451 10*3/uL — AB (ref 145–400)
RBC: 4.04 MIL/uL (ref 3.70–5.45)
RDW: 15.8 % — AB (ref 11.2–14.5)
WBC Count: 7.3 10*3/uL (ref 3.9–10.3)

## 2017-12-18 LAB — LACTATE DEHYDROGENASE: LDH: 180 U/L (ref 98–192)

## 2017-12-18 NOTE — Telephone Encounter (Signed)
Gave patient/relative avs report and appointments for November  °

## 2017-12-18 NOTE — Progress Notes (Signed)
[place] [phone]   Fax:([phone]) [place], [place] [address]  DIAGNOSIS: Essential thrombocythemia with positive JAK 2 mutation diagnosed in July 2016.   PRIOR THERAPY: None  CURRENT THERAPY: Hydroxyurea 500 mg orally every other day.     INTERVAL HISTORY: Valerie Lopez 83 y.o. female returns to the clinic today for follow-up visit accompanied by her daughter.  The patient is feeling fine today with no specific complaints except for some aching pain in her legs.  She has been on hydroxyurea supposedly on daily basis but she has been using it every other day and skipping few more days here and there.  She denied having any chest pain, shortness of breath, cough or hemoptysis.  She denied having any fever or chills.  She lost few pounds since her last visit.  She is here today for evaluation and repeat blood work.  She lives with her daughter in [place].   MEDICAL HISTORY: Past Medical History:  Diagnosis Date  . Dyslipidemia   . Hypertension   . Osteoporosis   . TIA (transient ischemic attack)   . Trigeminal neuralgia   . UTI (lower urinary tract infection)     ALLERGIES:  is allergic to zocor [simvastatin].  MEDICATIONS:  Current Outpatient Medications  Medication Sig Dispense Refill  . aspirin EC 81 MG tablet Take 81 mg by mouth daily.    Marland Kitchen ezetimibe (ZETIA) 10 MG tablet Take 10 mg by mouth daily.    . hydroxyurea (HYDREA) 500 MG capsule Take 1 capsule (500 mg total) by mouth daily. May take with food to minimize GI side effects. (Patient not taking: Reported on 10/23/2017) 30 capsule 2  . Krill Oil 300 MG CAPS Take 1 capsule by mouth daily.    Marland Kitchen lisinopril-hydrochlorothiazide (PRINZIDE,ZESTORETIC) 20-12.5 MG per tablet Take 1 tablet by mouth daily.    . prochlorperazine (COMPAZINE) 10 MG tablet Take 1 tablet (10 mg total) by mouth every 6 (six) hours as  needed for nausea or vomiting. 30 tablet 0   No current facility-administered medications for this visit.     SURGICAL HISTORY:  Past Surgical History:  Procedure Laterality Date  . CHOLECYSTECTOMY N/A 10/10/2014   Procedure: LAPAROSCOPIC CHOLECYSTECTOMY WITH INTRAOPERATIVE CHOLANGIOGRAM;  Surgeon: Armandina Gemma, MD;  Location: [facility];  Service: General;  Laterality: N/A;  . TUBAL LIGATION      REVIEW OF SYSTEMS:  A comprehensive review of systems was negative except for: Constitutional: positive for fatigue Musculoskeletal: positive for arthralgias   PHYSICAL EXAMINATION: General appearance: alert, cooperative and no distress Head: Normocephalic, without obvious abnormality, atraumatic Neck: no adenopathy, no JVD, supple, symmetrical, trachea midline and thyroid not enlarged, symmetric, no tenderness/mass/nodules Lymph nodes: Cervical, supraclavicular, and axillary nodes normal. Resp: clear to auscultation bilaterally Back: symmetric, no curvature. ROM normal. No CVA tenderness. Cardio: regular rate and rhythm, S1, S2 normal, no murmur, click, rub or gallop GI: soft, non-tender; bowel sounds normal; no masses,  no organomegaly Extremities: extremities normal, atraumatic, no cyanosis or edema  ECOG PERFORMANCE STATUS: 1 - Symptomatic but completely ambulatory  Blood pressure (!) 159/81, pulse 80, temperature 98 F (36.7 C), temperature source Oral, resp. rate 16, height 5' 5.5" (1.664 m), weight 122 lb 6.4 oz (55.5 kg), SpO2 98 %.  LABORATORY DATA: Lab Results  Component Value Date   WBC 7.3 12/18/2017   HGB 12.4 12/18/2017   HCT  38.7 12/18/2017   MCV 95.8 12/18/2017   PLT 451 (H) 12/18/2017      Chemistry      Component Value Date/Time   NA 132 (L) 10/23/2017 1324   NA 143 12/08/2014 1408   K 4.0 10/23/2017 1324   K 3.8 12/08/2014 1408   CL 92 (L) 10/23/2017 1324   CO2 31 (H) 10/23/2017 1324   CO2 32 (H) 12/08/2014 1408   BUN 22 10/23/2017 1324   BUN 14.4 12/08/2014  1408   CREATININE 1.72 (H) 10/23/2017 1324   CREATININE 1.0 12/08/2014 1408      Component Value Date/Time   CALCIUM 9.4 10/23/2017 1324   CALCIUM 9.6 12/08/2014 1408   ALKPHOS 78 10/23/2017 1324   ALKPHOS 69 12/08/2014 1408   AST 46 (H) 10/23/2017 1324   AST 18 12/08/2014 1408   ALT 50 10/23/2017 1324   ALT 10 12/08/2014 1408   BILITOT 0.9 10/23/2017 1324   BILITOT 0.40 12/08/2014 1408       RADIOGRAPHIC STUDIES: No results found.  ASSESSMENT AND PLAN: This is a very pleasant 83 years old white female recently diagnosed with essential thrombocythemia with positive JAK-2 mutation.  She is currently on hydroxyurea 500 mg but she uses it every other day.  She skips more days than taking it. She is feeling fine except for mild fatigue.  Her CBC today showed platelets count of 451,000. I recommended for the patient to stay on the same regiment every other day for now.  If her platelets count continues to increase we will may have to change the frequency to daily basis. She is also seeing a hematologist in [place] [facility].  She was advised to keep her appointment with them there for close monitoring of her blood count. I will see her back for follow-up visit in 3 months for evaluation with repeat blood work. The patient was advised to call immediately if she has any concerning symptoms in the interval. The patient voices understanding of current disease status and treatment options and is in agreement with the current care plan. All questions were answered. The patient knows to call the clinic with any problems, questions or concerns. We can certainly see the patient much sooner if necessary.  Disclaimer: This note was dictated with voice recognition software. Similar sounding words can inadvertently be transcribed and may not be corrected upon review.

## 2017-12-20 DIAGNOSIS — M2550 Pain in unspecified joint: Secondary | ICD-10-CM | POA: Diagnosis not present

## 2017-12-20 DIAGNOSIS — D899 Disorder involving the immune mechanism, unspecified: Secondary | ICD-10-CM | POA: Diagnosis not present

## 2017-12-29 DIAGNOSIS — D649 Anemia, unspecified: Secondary | ICD-10-CM | POA: Diagnosis not present

## 2017-12-29 DIAGNOSIS — D473 Essential (hemorrhagic) thrombocythemia: Secondary | ICD-10-CM | POA: Diagnosis not present

## 2018-01-10 DIAGNOSIS — M40294 Other kyphosis, thoracic region: Secondary | ICD-10-CM | POA: Diagnosis not present

## 2018-01-10 DIAGNOSIS — Z8679 Personal history of other diseases of the circulatory system: Secondary | ICD-10-CM | POA: Diagnosis not present

## 2018-01-10 DIAGNOSIS — R6889 Other general symptoms and signs: Secondary | ICD-10-CM | POA: Diagnosis not present

## 2018-01-10 DIAGNOSIS — M79661 Pain in right lower leg: Secondary | ICD-10-CM | POA: Diagnosis not present

## 2018-01-10 DIAGNOSIS — R2689 Other abnormalities of gait and mobility: Secondary | ICD-10-CM | POA: Diagnosis not present

## 2018-01-10 DIAGNOSIS — Z8739 Personal history of other diseases of the musculoskeletal system and connective tissue: Secondary | ICD-10-CM | POA: Diagnosis not present

## 2018-01-10 DIAGNOSIS — M79662 Pain in left lower leg: Secondary | ICD-10-CM | POA: Diagnosis not present

## 2018-01-10 DIAGNOSIS — M6281 Muscle weakness (generalized): Secondary | ICD-10-CM | POA: Diagnosis not present

## 2018-01-12 DIAGNOSIS — Z8679 Personal history of other diseases of the circulatory system: Secondary | ICD-10-CM | POA: Diagnosis not present

## 2018-01-12 DIAGNOSIS — M6281 Muscle weakness (generalized): Secondary | ICD-10-CM | POA: Diagnosis not present

## 2018-01-12 DIAGNOSIS — Z8739 Personal history of other diseases of the musculoskeletal system and connective tissue: Secondary | ICD-10-CM | POA: Diagnosis not present

## 2018-01-12 DIAGNOSIS — M79662 Pain in left lower leg: Secondary | ICD-10-CM | POA: Diagnosis not present

## 2018-01-12 DIAGNOSIS — R6889 Other general symptoms and signs: Secondary | ICD-10-CM | POA: Diagnosis not present

## 2018-01-12 DIAGNOSIS — M79661 Pain in right lower leg: Secondary | ICD-10-CM | POA: Diagnosis not present

## 2018-01-12 DIAGNOSIS — M40294 Other kyphosis, thoracic region: Secondary | ICD-10-CM | POA: Diagnosis not present

## 2018-01-12 DIAGNOSIS — R2689 Other abnormalities of gait and mobility: Secondary | ICD-10-CM | POA: Diagnosis not present

## 2018-01-17 DIAGNOSIS — M40294 Other kyphosis, thoracic region: Secondary | ICD-10-CM | POA: Diagnosis not present

## 2018-01-17 DIAGNOSIS — M6281 Muscle weakness (generalized): Secondary | ICD-10-CM | POA: Diagnosis not present

## 2018-01-17 DIAGNOSIS — R6889 Other general symptoms and signs: Secondary | ICD-10-CM | POA: Diagnosis not present

## 2018-01-17 DIAGNOSIS — Z8679 Personal history of other diseases of the circulatory system: Secondary | ICD-10-CM | POA: Diagnosis not present

## 2018-01-17 DIAGNOSIS — M79661 Pain in right lower leg: Secondary | ICD-10-CM | POA: Diagnosis not present

## 2018-01-17 DIAGNOSIS — R2689 Other abnormalities of gait and mobility: Secondary | ICD-10-CM | POA: Diagnosis not present

## 2018-01-17 DIAGNOSIS — M79662 Pain in left lower leg: Secondary | ICD-10-CM | POA: Diagnosis not present

## 2018-01-17 DIAGNOSIS — Z8739 Personal history of other diseases of the musculoskeletal system and connective tissue: Secondary | ICD-10-CM | POA: Diagnosis not present

## 2018-01-19 DIAGNOSIS — M79662 Pain in left lower leg: Secondary | ICD-10-CM | POA: Diagnosis not present

## 2018-01-19 DIAGNOSIS — M40294 Other kyphosis, thoracic region: Secondary | ICD-10-CM | POA: Diagnosis not present

## 2018-01-19 DIAGNOSIS — M6281 Muscle weakness (generalized): Secondary | ICD-10-CM | POA: Diagnosis not present

## 2018-01-19 DIAGNOSIS — Z8739 Personal history of other diseases of the musculoskeletal system and connective tissue: Secondary | ICD-10-CM | POA: Diagnosis not present

## 2018-01-19 DIAGNOSIS — M79661 Pain in right lower leg: Secondary | ICD-10-CM | POA: Diagnosis not present

## 2018-01-19 DIAGNOSIS — R2689 Other abnormalities of gait and mobility: Secondary | ICD-10-CM | POA: Diagnosis not present

## 2018-01-19 DIAGNOSIS — Z8679 Personal history of other diseases of the circulatory system: Secondary | ICD-10-CM | POA: Diagnosis not present

## 2018-01-19 DIAGNOSIS — R6889 Other general symptoms and signs: Secondary | ICD-10-CM | POA: Diagnosis not present

## 2018-01-23 DIAGNOSIS — M79661 Pain in right lower leg: Secondary | ICD-10-CM | POA: Diagnosis not present

## 2018-01-23 DIAGNOSIS — M40294 Other kyphosis, thoracic region: Secondary | ICD-10-CM | POA: Diagnosis not present

## 2018-01-23 DIAGNOSIS — M79662 Pain in left lower leg: Secondary | ICD-10-CM | POA: Diagnosis not present

## 2018-01-23 DIAGNOSIS — R2689 Other abnormalities of gait and mobility: Secondary | ICD-10-CM | POA: Diagnosis not present

## 2018-01-23 DIAGNOSIS — M6281 Muscle weakness (generalized): Secondary | ICD-10-CM | POA: Diagnosis not present

## 2018-01-23 DIAGNOSIS — Z8679 Personal history of other diseases of the circulatory system: Secondary | ICD-10-CM | POA: Diagnosis not present

## 2018-01-23 DIAGNOSIS — R6889 Other general symptoms and signs: Secondary | ICD-10-CM | POA: Diagnosis not present

## 2018-01-23 DIAGNOSIS — Z8739 Personal history of other diseases of the musculoskeletal system and connective tissue: Secondary | ICD-10-CM | POA: Diagnosis not present

## 2018-01-25 DIAGNOSIS — R6889 Other general symptoms and signs: Secondary | ICD-10-CM | POA: Diagnosis not present

## 2018-01-25 DIAGNOSIS — M79661 Pain in right lower leg: Secondary | ICD-10-CM | POA: Diagnosis not present

## 2018-01-25 DIAGNOSIS — M79662 Pain in left lower leg: Secondary | ICD-10-CM | POA: Diagnosis not present

## 2018-01-25 DIAGNOSIS — Z8679 Personal history of other diseases of the circulatory system: Secondary | ICD-10-CM | POA: Diagnosis not present

## 2018-01-25 DIAGNOSIS — M40294 Other kyphosis, thoracic region: Secondary | ICD-10-CM | POA: Diagnosis not present

## 2018-01-25 DIAGNOSIS — R2689 Other abnormalities of gait and mobility: Secondary | ICD-10-CM | POA: Diagnosis not present

## 2018-01-25 DIAGNOSIS — M6281 Muscle weakness (generalized): Secondary | ICD-10-CM | POA: Diagnosis not present

## 2018-01-25 DIAGNOSIS — Z8739 Personal history of other diseases of the musculoskeletal system and connective tissue: Secondary | ICD-10-CM | POA: Diagnosis not present

## 2018-01-30 DIAGNOSIS — M79662 Pain in left lower leg: Secondary | ICD-10-CM | POA: Diagnosis not present

## 2018-01-30 DIAGNOSIS — M6281 Muscle weakness (generalized): Secondary | ICD-10-CM | POA: Diagnosis not present

## 2018-01-30 DIAGNOSIS — M40294 Other kyphosis, thoracic region: Secondary | ICD-10-CM | POA: Diagnosis not present

## 2018-01-30 DIAGNOSIS — Z8679 Personal history of other diseases of the circulatory system: Secondary | ICD-10-CM | POA: Diagnosis not present

## 2018-01-30 DIAGNOSIS — M79661 Pain in right lower leg: Secondary | ICD-10-CM | POA: Diagnosis not present

## 2018-01-30 DIAGNOSIS — R6889 Other general symptoms and signs: Secondary | ICD-10-CM | POA: Diagnosis not present

## 2018-01-30 DIAGNOSIS — Z8739 Personal history of other diseases of the musculoskeletal system and connective tissue: Secondary | ICD-10-CM | POA: Diagnosis not present

## 2018-01-30 DIAGNOSIS — R2689 Other abnormalities of gait and mobility: Secondary | ICD-10-CM | POA: Diagnosis not present

## 2018-03-02 DIAGNOSIS — D649 Anemia, unspecified: Secondary | ICD-10-CM | POA: Diagnosis not present

## 2018-03-02 DIAGNOSIS — D473 Essential (hemorrhagic) thrombocythemia: Secondary | ICD-10-CM | POA: Diagnosis not present

## 2018-03-22 ENCOUNTER — Other Ambulatory Visit: Payer: Medicare HMO

## 2018-03-22 ENCOUNTER — Ambulatory Visit: Payer: Medicare HMO | Admitting: Internal Medicine

## 2018-03-22 DIAGNOSIS — R413 Other amnesia: Secondary | ICD-10-CM | POA: Diagnosis not present

## 2018-03-22 DIAGNOSIS — I10 Essential (primary) hypertension: Secondary | ICD-10-CM | POA: Diagnosis not present

## 2018-03-22 DIAGNOSIS — R1310 Dysphagia, unspecified: Secondary | ICD-10-CM | POA: Diagnosis not present

## 2018-03-22 DIAGNOSIS — R202 Paresthesia of skin: Secondary | ICD-10-CM | POA: Diagnosis not present

## 2018-03-22 DIAGNOSIS — Z23 Encounter for immunization: Secondary | ICD-10-CM | POA: Diagnosis not present

## 2018-05-09 DIAGNOSIS — I160 Hypertensive urgency: Secondary | ICD-10-CM | POA: Diagnosis not present

## 2018-05-09 DIAGNOSIS — M545 Low back pain: Secondary | ICD-10-CM | POA: Diagnosis not present

## 2018-05-10 DIAGNOSIS — Z7982 Long term (current) use of aspirin: Secondary | ICD-10-CM | POA: Diagnosis not present

## 2018-05-10 DIAGNOSIS — N83202 Unspecified ovarian cyst, left side: Secondary | ICD-10-CM | POA: Diagnosis not present

## 2018-05-10 DIAGNOSIS — Z888 Allergy status to other drugs, medicaments and biological substances status: Secondary | ICD-10-CM | POA: Diagnosis not present

## 2018-05-10 DIAGNOSIS — Z79899 Other long term (current) drug therapy: Secondary | ICD-10-CM | POA: Diagnosis not present

## 2018-05-10 DIAGNOSIS — Z91040 Latex allergy status: Secondary | ICD-10-CM | POA: Diagnosis not present

## 2018-05-10 DIAGNOSIS — Z8673 Personal history of transient ischemic attack (TIA), and cerebral infarction without residual deficits: Secondary | ICD-10-CM | POA: Diagnosis not present

## 2018-05-10 DIAGNOSIS — E785 Hyperlipidemia, unspecified: Secondary | ICD-10-CM | POA: Diagnosis not present

## 2018-05-10 DIAGNOSIS — M545 Low back pain: Secondary | ICD-10-CM | POA: Diagnosis not present

## 2018-05-10 DIAGNOSIS — N83201 Unspecified ovarian cyst, right side: Secondary | ICD-10-CM | POA: Diagnosis not present

## 2018-05-10 DIAGNOSIS — I10 Essential (primary) hypertension: Secondary | ICD-10-CM | POA: Diagnosis not present

## 2018-05-10 DIAGNOSIS — R52 Pain, unspecified: Secondary | ICD-10-CM | POA: Diagnosis not present

## 2018-05-22 DIAGNOSIS — N83201 Unspecified ovarian cyst, right side: Secondary | ICD-10-CM | POA: Diagnosis not present

## 2018-05-22 DIAGNOSIS — R102 Pelvic and perineal pain: Secondary | ICD-10-CM | POA: Diagnosis not present

## 2018-05-30 DIAGNOSIS — N83201 Unspecified ovarian cyst, right side: Secondary | ICD-10-CM | POA: Diagnosis not present

## 2018-05-30 DIAGNOSIS — N83299 Other ovarian cyst, unspecified side: Secondary | ICD-10-CM | POA: Diagnosis not present

## 2018-06-12 DIAGNOSIS — D473 Essential (hemorrhagic) thrombocythemia: Secondary | ICD-10-CM | POA: Diagnosis not present

## 2018-06-12 DIAGNOSIS — D649 Anemia, unspecified: Secondary | ICD-10-CM | POA: Diagnosis not present

## 2018-07-05 DIAGNOSIS — N83291 Other ovarian cyst, right side: Secondary | ICD-10-CM | POA: Diagnosis not present

## 2018-08-03 DIAGNOSIS — I10 Essential (primary) hypertension: Secondary | ICD-10-CM | POA: Diagnosis not present

## 2018-08-03 DIAGNOSIS — E785 Hyperlipidemia, unspecified: Secondary | ICD-10-CM | POA: Diagnosis not present

## 2018-08-03 DIAGNOSIS — R29898 Other symptoms and signs involving the musculoskeletal system: Secondary | ICD-10-CM | POA: Diagnosis not present

## 2018-08-23 DIAGNOSIS — G629 Polyneuropathy, unspecified: Secondary | ICD-10-CM | POA: Diagnosis not present

## 2018-08-23 DIAGNOSIS — M48061 Spinal stenosis, lumbar region without neurogenic claudication: Secondary | ICD-10-CM | POA: Diagnosis not present

## 2018-08-23 DIAGNOSIS — R413 Other amnesia: Secondary | ICD-10-CM | POA: Diagnosis not present

## 2018-10-05 DIAGNOSIS — D649 Anemia, unspecified: Secondary | ICD-10-CM | POA: Diagnosis not present

## 2018-10-05 DIAGNOSIS — D473 Essential (hemorrhagic) thrombocythemia: Secondary | ICD-10-CM | POA: Diagnosis not present

## 2018-10-10 DIAGNOSIS — N83201 Unspecified ovarian cyst, right side: Secondary | ICD-10-CM | POA: Diagnosis not present

## 2018-10-10 DIAGNOSIS — R109 Unspecified abdominal pain: Secondary | ICD-10-CM | POA: Diagnosis not present

## 2018-10-10 DIAGNOSIS — M898X8 Other specified disorders of bone, other site: Secondary | ICD-10-CM | POA: Diagnosis not present

## 2018-10-10 DIAGNOSIS — N83202 Unspecified ovarian cyst, left side: Secondary | ICD-10-CM | POA: Diagnosis not present

## 2018-10-10 DIAGNOSIS — Z9049 Acquired absence of other specified parts of digestive tract: Secondary | ICD-10-CM | POA: Diagnosis not present

## 2018-10-10 DIAGNOSIS — Z888 Allergy status to other drugs, medicaments and biological substances status: Secondary | ICD-10-CM | POA: Diagnosis not present

## 2018-10-10 DIAGNOSIS — M438X5 Other specified deforming dorsopathies, thoracolumbar region: Secondary | ICD-10-CM | POA: Diagnosis not present

## 2018-10-10 DIAGNOSIS — Q630 Accessory kidney: Secondary | ICD-10-CM | POA: Diagnosis not present

## 2018-10-10 DIAGNOSIS — K449 Diaphragmatic hernia without obstruction or gangrene: Secondary | ICD-10-CM | POA: Diagnosis not present

## 2019-01-04 DIAGNOSIS — D649 Anemia, unspecified: Secondary | ICD-10-CM | POA: Diagnosis not present

## 2019-01-04 DIAGNOSIS — D473 Essential (hemorrhagic) thrombocythemia: Secondary | ICD-10-CM | POA: Diagnosis not present

## 2019-01-20 DIAGNOSIS — Z9189 Other specified personal risk factors, not elsewhere classified: Secondary | ICD-10-CM | POA: Diagnosis not present

## 2019-01-20 DIAGNOSIS — Z20828 Contact with and (suspected) exposure to other viral communicable diseases: Secondary | ICD-10-CM | POA: Diagnosis not present

## 2019-01-20 DIAGNOSIS — S72001A Fracture of unspecified part of neck of right femur, initial encounter for closed fracture: Secondary | ICD-10-CM | POA: Diagnosis not present

## 2019-01-20 DIAGNOSIS — Z96641 Presence of right artificial hip joint: Secondary | ICD-10-CM | POA: Diagnosis not present

## 2019-01-20 DIAGNOSIS — R000 Tachycardia, unspecified: Secondary | ICD-10-CM | POA: Diagnosis not present

## 2019-01-20 DIAGNOSIS — Z471 Aftercare following joint replacement surgery: Secondary | ICD-10-CM | POA: Diagnosis not present

## 2019-01-20 DIAGNOSIS — R7989 Other specified abnormal findings of blood chemistry: Secondary | ICD-10-CM | POA: Diagnosis not present

## 2019-01-20 DIAGNOSIS — J181 Lobar pneumonia, unspecified organism: Secondary | ICD-10-CM | POA: Diagnosis not present

## 2019-01-20 DIAGNOSIS — S3993XA Unspecified injury of pelvis, initial encounter: Secondary | ICD-10-CM | POA: Diagnosis not present

## 2019-01-20 DIAGNOSIS — K5901 Slow transit constipation: Secondary | ICD-10-CM | POA: Diagnosis not present

## 2019-01-20 DIAGNOSIS — M25551 Pain in right hip: Secondary | ICD-10-CM | POA: Diagnosis not present

## 2019-01-20 DIAGNOSIS — J189 Pneumonia, unspecified organism: Secondary | ICD-10-CM | POA: Diagnosis not present

## 2019-01-20 DIAGNOSIS — R0609 Other forms of dyspnea: Secondary | ICD-10-CM | POA: Diagnosis not present

## 2019-01-20 DIAGNOSIS — R918 Other nonspecific abnormal finding of lung field: Secondary | ICD-10-CM | POA: Diagnosis not present

## 2019-01-20 DIAGNOSIS — R102 Pelvic and perineal pain: Secondary | ICD-10-CM | POA: Diagnosis not present

## 2019-01-20 DIAGNOSIS — R0689 Other abnormalities of breathing: Secondary | ICD-10-CM | POA: Diagnosis not present

## 2019-01-20 DIAGNOSIS — Z01818 Encounter for other preprocedural examination: Secondary | ICD-10-CM | POA: Diagnosis not present

## 2019-01-20 DIAGNOSIS — E861 Hypovolemia: Secondary | ICD-10-CM | POA: Diagnosis not present

## 2019-01-20 DIAGNOSIS — I9589 Other hypotension: Secondary | ICD-10-CM | POA: Diagnosis not present

## 2019-01-20 DIAGNOSIS — Z8673 Personal history of transient ischemic attack (TIA), and cerebral infarction without residual deficits: Secondary | ICD-10-CM | POA: Diagnosis not present

## 2019-01-20 DIAGNOSIS — R4182 Altered mental status, unspecified: Secondary | ICD-10-CM | POA: Diagnosis not present

## 2019-01-20 DIAGNOSIS — Z01810 Encounter for preprocedural cardiovascular examination: Secondary | ICD-10-CM | POA: Diagnosis not present

## 2019-01-20 DIAGNOSIS — R52 Pain, unspecified: Secondary | ICD-10-CM | POA: Diagnosis not present

## 2019-01-20 DIAGNOSIS — I10 Essential (primary) hypertension: Secondary | ICD-10-CM | POA: Diagnosis not present

## 2019-01-20 DIAGNOSIS — G8911 Acute pain due to trauma: Secondary | ICD-10-CM | POA: Diagnosis not present

## 2019-01-20 DIAGNOSIS — M1611 Unilateral primary osteoarthritis, right hip: Secondary | ICD-10-CM | POA: Diagnosis not present

## 2019-01-20 DIAGNOSIS — G8918 Other acute postprocedural pain: Secondary | ICD-10-CM | POA: Diagnosis not present

## 2019-01-20 DIAGNOSIS — J9811 Atelectasis: Secondary | ICD-10-CM | POA: Diagnosis not present

## 2019-01-20 DIAGNOSIS — Z9889 Other specified postprocedural states: Secondary | ICD-10-CM | POA: Diagnosis not present

## 2019-01-20 DIAGNOSIS — E785 Hyperlipidemia, unspecified: Secondary | ICD-10-CM | POA: Diagnosis not present

## 2019-01-20 DIAGNOSIS — R110 Nausea: Secondary | ICD-10-CM | POA: Diagnosis not present

## 2019-01-20 DIAGNOSIS — S72099A Other fracture of head and neck of unspecified femur, initial encounter for closed fracture: Secondary | ICD-10-CM | POA: Diagnosis not present

## 2019-01-20 DIAGNOSIS — E871 Hypo-osmolality and hyponatremia: Secondary | ICD-10-CM | POA: Diagnosis not present

## 2019-01-20 DIAGNOSIS — S72091A Other fracture of head and neck of right femur, initial encounter for closed fracture: Secondary | ICD-10-CM | POA: Diagnosis not present

## 2019-01-20 DIAGNOSIS — D473 Essential (hemorrhagic) thrombocythemia: Secondary | ICD-10-CM | POA: Diagnosis not present

## 2019-01-20 DIAGNOSIS — K5900 Constipation, unspecified: Secondary | ICD-10-CM | POA: Diagnosis not present

## 2019-01-20 DIAGNOSIS — W19XXXA Unspecified fall, initial encounter: Secondary | ICD-10-CM | POA: Diagnosis not present

## 2019-01-20 DIAGNOSIS — I700 Atherosclerosis of aorta: Secondary | ICD-10-CM | POA: Diagnosis not present

## 2019-01-27 DIAGNOSIS — S72001D Fracture of unspecified part of neck of right femur, subsequent encounter for closed fracture with routine healing: Secondary | ICD-10-CM | POA: Diagnosis not present

## 2019-01-27 DIAGNOSIS — E785 Hyperlipidemia, unspecified: Secondary | ICD-10-CM | POA: Diagnosis not present

## 2019-01-27 DIAGNOSIS — Z96641 Presence of right artificial hip joint: Secondary | ICD-10-CM | POA: Diagnosis not present

## 2019-01-27 DIAGNOSIS — Z8673 Personal history of transient ischemic attack (TIA), and cerebral infarction without residual deficits: Secondary | ICD-10-CM | POA: Diagnosis not present

## 2019-01-27 DIAGNOSIS — Z7982 Long term (current) use of aspirin: Secondary | ICD-10-CM | POA: Diagnosis not present

## 2019-01-27 DIAGNOSIS — I10 Essential (primary) hypertension: Secondary | ICD-10-CM | POA: Diagnosis not present

## 2019-01-27 DIAGNOSIS — M79651 Pain in right thigh: Secondary | ICD-10-CM | POA: Diagnosis not present

## 2019-01-27 DIAGNOSIS — D473 Essential (hemorrhagic) thrombocythemia: Secondary | ICD-10-CM | POA: Diagnosis not present

## 2019-01-31 DIAGNOSIS — S72001D Fracture of unspecified part of neck of right femur, subsequent encounter for closed fracture with routine healing: Secondary | ICD-10-CM | POA: Diagnosis not present

## 2019-01-31 DIAGNOSIS — Z8781 Personal history of (healed) traumatic fracture: Secondary | ICD-10-CM | POA: Diagnosis not present

## 2019-01-31 DIAGNOSIS — M810 Age-related osteoporosis without current pathological fracture: Secondary | ICD-10-CM | POA: Diagnosis not present

## 2019-02-07 DIAGNOSIS — S72091D Other fracture of head and neck of right femur, subsequent encounter for closed fracture with routine healing: Secondary | ICD-10-CM | POA: Diagnosis not present

## 2019-02-22 DIAGNOSIS — S72001A Fracture of unspecified part of neck of right femur, initial encounter for closed fracture: Secondary | ICD-10-CM | POA: Diagnosis not present

## 2019-02-26 DIAGNOSIS — Z8673 Personal history of transient ischemic attack (TIA), and cerebral infarction without residual deficits: Secondary | ICD-10-CM | POA: Diagnosis not present

## 2019-02-26 DIAGNOSIS — Z96641 Presence of right artificial hip joint: Secondary | ICD-10-CM | POA: Diagnosis not present

## 2019-02-26 DIAGNOSIS — Z7982 Long term (current) use of aspirin: Secondary | ICD-10-CM | POA: Diagnosis not present

## 2019-02-26 DIAGNOSIS — E785 Hyperlipidemia, unspecified: Secondary | ICD-10-CM | POA: Diagnosis not present

## 2019-02-26 DIAGNOSIS — D473 Essential (hemorrhagic) thrombocythemia: Secondary | ICD-10-CM | POA: Diagnosis not present

## 2019-02-26 DIAGNOSIS — M79651 Pain in right thigh: Secondary | ICD-10-CM | POA: Diagnosis not present

## 2019-02-26 DIAGNOSIS — I10 Essential (primary) hypertension: Secondary | ICD-10-CM | POA: Diagnosis not present

## 2019-02-26 DIAGNOSIS — S72001D Fracture of unspecified part of neck of right femur, subsequent encounter for closed fracture with routine healing: Secondary | ICD-10-CM | POA: Diagnosis not present

## 2019-02-28 DIAGNOSIS — E785 Hyperlipidemia, unspecified: Secondary | ICD-10-CM | POA: Diagnosis not present

## 2019-02-28 DIAGNOSIS — Z8673 Personal history of transient ischemic attack (TIA), and cerebral infarction without residual deficits: Secondary | ICD-10-CM | POA: Diagnosis not present

## 2019-02-28 DIAGNOSIS — I10 Essential (primary) hypertension: Secondary | ICD-10-CM | POA: Diagnosis not present

## 2019-02-28 DIAGNOSIS — Z7982 Long term (current) use of aspirin: Secondary | ICD-10-CM | POA: Diagnosis not present

## 2019-02-28 DIAGNOSIS — D473 Essential (hemorrhagic) thrombocythemia: Secondary | ICD-10-CM | POA: Diagnosis not present

## 2019-02-28 DIAGNOSIS — M79651 Pain in right thigh: Secondary | ICD-10-CM | POA: Diagnosis not present

## 2019-02-28 DIAGNOSIS — Z96641 Presence of right artificial hip joint: Secondary | ICD-10-CM | POA: Diagnosis not present

## 2019-02-28 DIAGNOSIS — S72001D Fracture of unspecified part of neck of right femur, subsequent encounter for closed fracture with routine healing: Secondary | ICD-10-CM | POA: Diagnosis not present

## 2019-03-05 DIAGNOSIS — Z8673 Personal history of transient ischemic attack (TIA), and cerebral infarction without residual deficits: Secondary | ICD-10-CM | POA: Diagnosis not present

## 2019-03-05 DIAGNOSIS — Z96641 Presence of right artificial hip joint: Secondary | ICD-10-CM | POA: Diagnosis not present

## 2019-03-05 DIAGNOSIS — Z7982 Long term (current) use of aspirin: Secondary | ICD-10-CM | POA: Diagnosis not present

## 2019-03-05 DIAGNOSIS — E785 Hyperlipidemia, unspecified: Secondary | ICD-10-CM | POA: Diagnosis not present

## 2019-03-05 DIAGNOSIS — I10 Essential (primary) hypertension: Secondary | ICD-10-CM | POA: Diagnosis not present

## 2019-03-05 DIAGNOSIS — D473 Essential (hemorrhagic) thrombocythemia: Secondary | ICD-10-CM | POA: Diagnosis not present

## 2019-03-05 DIAGNOSIS — M79651 Pain in right thigh: Secondary | ICD-10-CM | POA: Diagnosis not present

## 2019-03-05 DIAGNOSIS — S72001D Fracture of unspecified part of neck of right femur, subsequent encounter for closed fracture with routine healing: Secondary | ICD-10-CM | POA: Diagnosis not present

## 2019-03-07 DIAGNOSIS — Z780 Asymptomatic menopausal state: Secondary | ICD-10-CM | POA: Diagnosis not present

## 2019-03-07 DIAGNOSIS — Z13820 Encounter for screening for osteoporosis: Secondary | ICD-10-CM | POA: Diagnosis not present

## 2019-03-07 DIAGNOSIS — S72001D Fracture of unspecified part of neck of right femur, subsequent encounter for closed fracture with routine healing: Secondary | ICD-10-CM | POA: Diagnosis not present

## 2019-03-07 DIAGNOSIS — M810 Age-related osteoporosis without current pathological fracture: Secondary | ICD-10-CM | POA: Diagnosis not present

## 2019-03-07 DIAGNOSIS — E2839 Other primary ovarian failure: Secondary | ICD-10-CM | POA: Diagnosis not present

## 2019-03-08 DIAGNOSIS — I10 Essential (primary) hypertension: Secondary | ICD-10-CM | POA: Diagnosis not present

## 2019-03-08 DIAGNOSIS — E785 Hyperlipidemia, unspecified: Secondary | ICD-10-CM | POA: Diagnosis not present

## 2019-03-08 DIAGNOSIS — Z96641 Presence of right artificial hip joint: Secondary | ICD-10-CM | POA: Diagnosis not present

## 2019-03-08 DIAGNOSIS — D473 Essential (hemorrhagic) thrombocythemia: Secondary | ICD-10-CM | POA: Diagnosis not present

## 2019-03-08 DIAGNOSIS — M79651 Pain in right thigh: Secondary | ICD-10-CM | POA: Diagnosis not present

## 2019-03-08 DIAGNOSIS — S72001D Fracture of unspecified part of neck of right femur, subsequent encounter for closed fracture with routine healing: Secondary | ICD-10-CM | POA: Diagnosis not present

## 2019-03-08 DIAGNOSIS — Z8673 Personal history of transient ischemic attack (TIA), and cerebral infarction without residual deficits: Secondary | ICD-10-CM | POA: Diagnosis not present

## 2019-03-08 DIAGNOSIS — Z7982 Long term (current) use of aspirin: Secondary | ICD-10-CM | POA: Diagnosis not present

## 2019-03-11 DIAGNOSIS — D473 Essential (hemorrhagic) thrombocythemia: Secondary | ICD-10-CM | POA: Diagnosis not present

## 2019-03-11 DIAGNOSIS — I10 Essential (primary) hypertension: Secondary | ICD-10-CM | POA: Diagnosis not present

## 2019-03-11 DIAGNOSIS — E785 Hyperlipidemia, unspecified: Secondary | ICD-10-CM | POA: Diagnosis not present

## 2019-03-11 DIAGNOSIS — Z8673 Personal history of transient ischemic attack (TIA), and cerebral infarction without residual deficits: Secondary | ICD-10-CM | POA: Diagnosis not present

## 2019-03-11 DIAGNOSIS — S72001D Fracture of unspecified part of neck of right femur, subsequent encounter for closed fracture with routine healing: Secondary | ICD-10-CM | POA: Diagnosis not present

## 2019-03-11 DIAGNOSIS — M79651 Pain in right thigh: Secondary | ICD-10-CM | POA: Diagnosis not present

## 2019-03-11 DIAGNOSIS — Z7982 Long term (current) use of aspirin: Secondary | ICD-10-CM | POA: Diagnosis not present

## 2019-03-11 DIAGNOSIS — Z96641 Presence of right artificial hip joint: Secondary | ICD-10-CM | POA: Diagnosis not present

## 2019-03-13 DIAGNOSIS — S72001D Fracture of unspecified part of neck of right femur, subsequent encounter for closed fracture with routine healing: Secondary | ICD-10-CM | POA: Diagnosis not present

## 2019-03-13 DIAGNOSIS — Z7982 Long term (current) use of aspirin: Secondary | ICD-10-CM | POA: Diagnosis not present

## 2019-03-13 DIAGNOSIS — Z8673 Personal history of transient ischemic attack (TIA), and cerebral infarction without residual deficits: Secondary | ICD-10-CM | POA: Diagnosis not present

## 2019-03-13 DIAGNOSIS — Z96641 Presence of right artificial hip joint: Secondary | ICD-10-CM | POA: Diagnosis not present

## 2019-03-13 DIAGNOSIS — E785 Hyperlipidemia, unspecified: Secondary | ICD-10-CM | POA: Diagnosis not present

## 2019-03-13 DIAGNOSIS — M79651 Pain in right thigh: Secondary | ICD-10-CM | POA: Diagnosis not present

## 2019-03-13 DIAGNOSIS — D473 Essential (hemorrhagic) thrombocythemia: Secondary | ICD-10-CM | POA: Diagnosis not present

## 2019-03-13 DIAGNOSIS — I10 Essential (primary) hypertension: Secondary | ICD-10-CM | POA: Diagnosis not present

## 2019-03-15 DIAGNOSIS — I10 Essential (primary) hypertension: Secondary | ICD-10-CM | POA: Diagnosis not present

## 2019-03-15 DIAGNOSIS — E785 Hyperlipidemia, unspecified: Secondary | ICD-10-CM | POA: Diagnosis not present

## 2019-03-15 DIAGNOSIS — M79651 Pain in right thigh: Secondary | ICD-10-CM | POA: Diagnosis not present

## 2019-03-15 DIAGNOSIS — Z8673 Personal history of transient ischemic attack (TIA), and cerebral infarction without residual deficits: Secondary | ICD-10-CM | POA: Diagnosis not present

## 2019-03-15 DIAGNOSIS — D473 Essential (hemorrhagic) thrombocythemia: Secondary | ICD-10-CM | POA: Diagnosis not present

## 2019-03-15 DIAGNOSIS — S72001D Fracture of unspecified part of neck of right femur, subsequent encounter for closed fracture with routine healing: Secondary | ICD-10-CM | POA: Diagnosis not present

## 2019-03-15 DIAGNOSIS — Z7982 Long term (current) use of aspirin: Secondary | ICD-10-CM | POA: Diagnosis not present

## 2019-03-15 DIAGNOSIS — Z96641 Presence of right artificial hip joint: Secondary | ICD-10-CM | POA: Diagnosis not present

## 2019-03-16 DIAGNOSIS — R69 Illness, unspecified: Secondary | ICD-10-CM | POA: Diagnosis not present

## 2019-03-18 DIAGNOSIS — E785 Hyperlipidemia, unspecified: Secondary | ICD-10-CM | POA: Diagnosis not present

## 2019-03-18 DIAGNOSIS — I10 Essential (primary) hypertension: Secondary | ICD-10-CM | POA: Diagnosis not present

## 2019-03-18 DIAGNOSIS — M79651 Pain in right thigh: Secondary | ICD-10-CM | POA: Diagnosis not present

## 2019-03-18 DIAGNOSIS — S72001D Fracture of unspecified part of neck of right femur, subsequent encounter for closed fracture with routine healing: Secondary | ICD-10-CM | POA: Diagnosis not present

## 2019-03-18 DIAGNOSIS — Z8673 Personal history of transient ischemic attack (TIA), and cerebral infarction without residual deficits: Secondary | ICD-10-CM | POA: Diagnosis not present

## 2019-03-18 DIAGNOSIS — D473 Essential (hemorrhagic) thrombocythemia: Secondary | ICD-10-CM | POA: Diagnosis not present

## 2019-03-18 DIAGNOSIS — Z96641 Presence of right artificial hip joint: Secondary | ICD-10-CM | POA: Diagnosis not present

## 2019-03-18 DIAGNOSIS — Z7982 Long term (current) use of aspirin: Secondary | ICD-10-CM | POA: Diagnosis not present

## 2019-03-20 DIAGNOSIS — Z7982 Long term (current) use of aspirin: Secondary | ICD-10-CM | POA: Diagnosis not present

## 2019-03-20 DIAGNOSIS — M79651 Pain in right thigh: Secondary | ICD-10-CM | POA: Diagnosis not present

## 2019-03-20 DIAGNOSIS — D473 Essential (hemorrhagic) thrombocythemia: Secondary | ICD-10-CM | POA: Diagnosis not present

## 2019-03-20 DIAGNOSIS — Z96641 Presence of right artificial hip joint: Secondary | ICD-10-CM | POA: Diagnosis not present

## 2019-03-20 DIAGNOSIS — S72001D Fracture of unspecified part of neck of right femur, subsequent encounter for closed fracture with routine healing: Secondary | ICD-10-CM | POA: Diagnosis not present

## 2019-03-20 DIAGNOSIS — E785 Hyperlipidemia, unspecified: Secondary | ICD-10-CM | POA: Diagnosis not present

## 2019-03-20 DIAGNOSIS — I10 Essential (primary) hypertension: Secondary | ICD-10-CM | POA: Diagnosis not present

## 2019-03-20 DIAGNOSIS — Z8673 Personal history of transient ischemic attack (TIA), and cerebral infarction without residual deficits: Secondary | ICD-10-CM | POA: Diagnosis not present

## 2019-03-22 DIAGNOSIS — Z8673 Personal history of transient ischemic attack (TIA), and cerebral infarction without residual deficits: Secondary | ICD-10-CM | POA: Diagnosis not present

## 2019-03-22 DIAGNOSIS — S72001D Fracture of unspecified part of neck of right femur, subsequent encounter for closed fracture with routine healing: Secondary | ICD-10-CM | POA: Diagnosis not present

## 2019-03-22 DIAGNOSIS — Z7982 Long term (current) use of aspirin: Secondary | ICD-10-CM | POA: Diagnosis not present

## 2019-03-22 DIAGNOSIS — D473 Essential (hemorrhagic) thrombocythemia: Secondary | ICD-10-CM | POA: Diagnosis not present

## 2019-03-22 DIAGNOSIS — Z96641 Presence of right artificial hip joint: Secondary | ICD-10-CM | POA: Diagnosis not present

## 2019-03-22 DIAGNOSIS — I10 Essential (primary) hypertension: Secondary | ICD-10-CM | POA: Diagnosis not present

## 2019-03-22 DIAGNOSIS — M79651 Pain in right thigh: Secondary | ICD-10-CM | POA: Diagnosis not present

## 2019-03-22 DIAGNOSIS — E785 Hyperlipidemia, unspecified: Secondary | ICD-10-CM | POA: Diagnosis not present

## 2019-03-25 DIAGNOSIS — S72001A Fracture of unspecified part of neck of right femur, initial encounter for closed fracture: Secondary | ICD-10-CM | POA: Diagnosis not present

## 2019-03-26 DIAGNOSIS — D473 Essential (hemorrhagic) thrombocythemia: Secondary | ICD-10-CM | POA: Diagnosis not present

## 2019-03-26 DIAGNOSIS — S72001D Fracture of unspecified part of neck of right femur, subsequent encounter for closed fracture with routine healing: Secondary | ICD-10-CM | POA: Diagnosis not present

## 2019-03-26 DIAGNOSIS — Z96641 Presence of right artificial hip joint: Secondary | ICD-10-CM | POA: Diagnosis not present

## 2019-03-26 DIAGNOSIS — Z8673 Personal history of transient ischemic attack (TIA), and cerebral infarction without residual deficits: Secondary | ICD-10-CM | POA: Diagnosis not present

## 2019-03-26 DIAGNOSIS — Z7982 Long term (current) use of aspirin: Secondary | ICD-10-CM | POA: Diagnosis not present

## 2019-03-26 DIAGNOSIS — E785 Hyperlipidemia, unspecified: Secondary | ICD-10-CM | POA: Diagnosis not present

## 2019-03-26 DIAGNOSIS — I10 Essential (primary) hypertension: Secondary | ICD-10-CM | POA: Diagnosis not present

## 2019-03-26 DIAGNOSIS — M79651 Pain in right thigh: Secondary | ICD-10-CM | POA: Diagnosis not present

## 2019-03-28 DIAGNOSIS — Z8673 Personal history of transient ischemic attack (TIA), and cerebral infarction without residual deficits: Secondary | ICD-10-CM | POA: Diagnosis not present

## 2019-03-28 DIAGNOSIS — E785 Hyperlipidemia, unspecified: Secondary | ICD-10-CM | POA: Diagnosis not present

## 2019-03-28 DIAGNOSIS — D473 Essential (hemorrhagic) thrombocythemia: Secondary | ICD-10-CM | POA: Diagnosis not present

## 2019-03-28 DIAGNOSIS — Z96641 Presence of right artificial hip joint: Secondary | ICD-10-CM | POA: Diagnosis not present

## 2019-03-28 DIAGNOSIS — I10 Essential (primary) hypertension: Secondary | ICD-10-CM | POA: Diagnosis not present

## 2019-03-28 DIAGNOSIS — S72001D Fracture of unspecified part of neck of right femur, subsequent encounter for closed fracture with routine healing: Secondary | ICD-10-CM | POA: Diagnosis not present

## 2019-04-01 DIAGNOSIS — S72001D Fracture of unspecified part of neck of right femur, subsequent encounter for closed fracture with routine healing: Secondary | ICD-10-CM | POA: Diagnosis not present

## 2019-04-01 DIAGNOSIS — Z96641 Presence of right artificial hip joint: Secondary | ICD-10-CM | POA: Diagnosis not present

## 2019-04-01 DIAGNOSIS — D473 Essential (hemorrhagic) thrombocythemia: Secondary | ICD-10-CM | POA: Diagnosis not present

## 2019-04-01 DIAGNOSIS — E785 Hyperlipidemia, unspecified: Secondary | ICD-10-CM | POA: Diagnosis not present

## 2019-04-01 DIAGNOSIS — I10 Essential (primary) hypertension: Secondary | ICD-10-CM | POA: Diagnosis not present

## 2019-04-01 DIAGNOSIS — Z8673 Personal history of transient ischemic attack (TIA), and cerebral infarction without residual deficits: Secondary | ICD-10-CM | POA: Diagnosis not present

## 2019-04-04 DIAGNOSIS — S72001D Fracture of unspecified part of neck of right femur, subsequent encounter for closed fracture with routine healing: Secondary | ICD-10-CM | POA: Diagnosis not present

## 2019-04-04 DIAGNOSIS — E785 Hyperlipidemia, unspecified: Secondary | ICD-10-CM | POA: Diagnosis not present

## 2019-04-04 DIAGNOSIS — M810 Age-related osteoporosis without current pathological fracture: Secondary | ICD-10-CM | POA: Diagnosis not present

## 2019-04-04 DIAGNOSIS — I10 Essential (primary) hypertension: Secondary | ICD-10-CM | POA: Diagnosis not present

## 2019-04-04 DIAGNOSIS — E7849 Other hyperlipidemia: Secondary | ICD-10-CM | POA: Diagnosis not present

## 2019-04-04 DIAGNOSIS — S728X1S Other fracture of right femur, sequela: Secondary | ICD-10-CM | POA: Diagnosis not present

## 2019-04-04 DIAGNOSIS — Z96641 Presence of right artificial hip joint: Secondary | ICD-10-CM | POA: Diagnosis not present

## 2019-04-04 DIAGNOSIS — D473 Essential (hemorrhagic) thrombocythemia: Secondary | ICD-10-CM | POA: Diagnosis not present

## 2019-04-04 DIAGNOSIS — Z713 Dietary counseling and surveillance: Secondary | ICD-10-CM | POA: Diagnosis not present

## 2019-04-04 DIAGNOSIS — Z8673 Personal history of transient ischemic attack (TIA), and cerebral infarction without residual deficits: Secondary | ICD-10-CM | POA: Diagnosis not present

## 2019-04-04 DIAGNOSIS — Z7189 Other specified counseling: Secondary | ICD-10-CM | POA: Diagnosis not present

## 2019-04-10 DIAGNOSIS — E785 Hyperlipidemia, unspecified: Secondary | ICD-10-CM | POA: Diagnosis not present

## 2019-04-10 DIAGNOSIS — I10 Essential (primary) hypertension: Secondary | ICD-10-CM | POA: Diagnosis not present

## 2019-04-10 DIAGNOSIS — Z8673 Personal history of transient ischemic attack (TIA), and cerebral infarction without residual deficits: Secondary | ICD-10-CM | POA: Diagnosis not present

## 2019-04-10 DIAGNOSIS — S72001D Fracture of unspecified part of neck of right femur, subsequent encounter for closed fracture with routine healing: Secondary | ICD-10-CM | POA: Diagnosis not present

## 2019-04-10 DIAGNOSIS — Z96641 Presence of right artificial hip joint: Secondary | ICD-10-CM | POA: Diagnosis not present

## 2019-04-10 DIAGNOSIS — D473 Essential (hemorrhagic) thrombocythemia: Secondary | ICD-10-CM | POA: Diagnosis not present

## 2019-04-12 DIAGNOSIS — D473 Essential (hemorrhagic) thrombocythemia: Secondary | ICD-10-CM | POA: Diagnosis not present

## 2019-04-12 DIAGNOSIS — I10 Essential (primary) hypertension: Secondary | ICD-10-CM | POA: Diagnosis not present

## 2019-04-12 DIAGNOSIS — Z8673 Personal history of transient ischemic attack (TIA), and cerebral infarction without residual deficits: Secondary | ICD-10-CM | POA: Diagnosis not present

## 2019-04-12 DIAGNOSIS — S72001D Fracture of unspecified part of neck of right femur, subsequent encounter for closed fracture with routine healing: Secondary | ICD-10-CM | POA: Diagnosis not present

## 2019-04-12 DIAGNOSIS — E785 Hyperlipidemia, unspecified: Secondary | ICD-10-CM | POA: Diagnosis not present

## 2019-04-12 DIAGNOSIS — Z96641 Presence of right artificial hip joint: Secondary | ICD-10-CM | POA: Diagnosis not present

## 2019-04-16 DIAGNOSIS — Z96641 Presence of right artificial hip joint: Secondary | ICD-10-CM | POA: Diagnosis not present

## 2019-04-16 DIAGNOSIS — S72001D Fracture of unspecified part of neck of right femur, subsequent encounter for closed fracture with routine healing: Secondary | ICD-10-CM | POA: Diagnosis not present

## 2019-04-16 DIAGNOSIS — E785 Hyperlipidemia, unspecified: Secondary | ICD-10-CM | POA: Diagnosis not present

## 2019-04-16 DIAGNOSIS — I10 Essential (primary) hypertension: Secondary | ICD-10-CM | POA: Diagnosis not present

## 2019-04-16 DIAGNOSIS — Z8673 Personal history of transient ischemic attack (TIA), and cerebral infarction without residual deficits: Secondary | ICD-10-CM | POA: Diagnosis not present

## 2019-04-16 DIAGNOSIS — D473 Essential (hemorrhagic) thrombocythemia: Secondary | ICD-10-CM | POA: Diagnosis not present

## 2019-04-19 DIAGNOSIS — E785 Hyperlipidemia, unspecified: Secondary | ICD-10-CM | POA: Diagnosis not present

## 2019-04-19 DIAGNOSIS — I10 Essential (primary) hypertension: Secondary | ICD-10-CM | POA: Diagnosis not present

## 2019-04-19 DIAGNOSIS — D473 Essential (hemorrhagic) thrombocythemia: Secondary | ICD-10-CM | POA: Diagnosis not present

## 2019-04-19 DIAGNOSIS — S72001D Fracture of unspecified part of neck of right femur, subsequent encounter for closed fracture with routine healing: Secondary | ICD-10-CM | POA: Diagnosis not present

## 2019-04-19 DIAGNOSIS — Z96641 Presence of right artificial hip joint: Secondary | ICD-10-CM | POA: Diagnosis not present

## 2019-04-19 DIAGNOSIS — Z8673 Personal history of transient ischemic attack (TIA), and cerebral infarction without residual deficits: Secondary | ICD-10-CM | POA: Diagnosis not present

## 2019-04-22 DIAGNOSIS — Z96641 Presence of right artificial hip joint: Secondary | ICD-10-CM | POA: Diagnosis not present

## 2019-04-22 DIAGNOSIS — S72001D Fracture of unspecified part of neck of right femur, subsequent encounter for closed fracture with routine healing: Secondary | ICD-10-CM | POA: Diagnosis not present

## 2019-04-22 DIAGNOSIS — D473 Essential (hemorrhagic) thrombocythemia: Secondary | ICD-10-CM | POA: Diagnosis not present

## 2019-04-22 DIAGNOSIS — E785 Hyperlipidemia, unspecified: Secondary | ICD-10-CM | POA: Diagnosis not present

## 2019-04-22 DIAGNOSIS — I10 Essential (primary) hypertension: Secondary | ICD-10-CM | POA: Diagnosis not present

## 2019-04-22 DIAGNOSIS — Z8673 Personal history of transient ischemic attack (TIA), and cerebral infarction without residual deficits: Secondary | ICD-10-CM | POA: Diagnosis not present

## 2019-04-24 DIAGNOSIS — D473 Essential (hemorrhagic) thrombocythemia: Secondary | ICD-10-CM | POA: Diagnosis not present

## 2019-04-24 DIAGNOSIS — E785 Hyperlipidemia, unspecified: Secondary | ICD-10-CM | POA: Diagnosis not present

## 2019-04-24 DIAGNOSIS — I10 Essential (primary) hypertension: Secondary | ICD-10-CM | POA: Diagnosis not present

## 2019-04-24 DIAGNOSIS — S72001D Fracture of unspecified part of neck of right femur, subsequent encounter for closed fracture with routine healing: Secondary | ICD-10-CM | POA: Diagnosis not present

## 2019-04-24 DIAGNOSIS — Z8673 Personal history of transient ischemic attack (TIA), and cerebral infarction without residual deficits: Secondary | ICD-10-CM | POA: Diagnosis not present

## 2019-04-24 DIAGNOSIS — Z96641 Presence of right artificial hip joint: Secondary | ICD-10-CM | POA: Diagnosis not present

## 2019-04-24 DIAGNOSIS — S72001A Fracture of unspecified part of neck of right femur, initial encounter for closed fracture: Secondary | ICD-10-CM | POA: Diagnosis not present

## 2019-04-27 DIAGNOSIS — E785 Hyperlipidemia, unspecified: Secondary | ICD-10-CM | POA: Diagnosis not present

## 2019-04-27 DIAGNOSIS — Z8673 Personal history of transient ischemic attack (TIA), and cerebral infarction without residual deficits: Secondary | ICD-10-CM | POA: Diagnosis not present

## 2019-04-27 DIAGNOSIS — D473 Essential (hemorrhagic) thrombocythemia: Secondary | ICD-10-CM | POA: Diagnosis not present

## 2019-04-27 DIAGNOSIS — I10 Essential (primary) hypertension: Secondary | ICD-10-CM | POA: Diagnosis not present

## 2019-04-27 DIAGNOSIS — S72001D Fracture of unspecified part of neck of right femur, subsequent encounter for closed fracture with routine healing: Secondary | ICD-10-CM | POA: Diagnosis not present

## 2019-04-27 DIAGNOSIS — Z96641 Presence of right artificial hip joint: Secondary | ICD-10-CM | POA: Diagnosis not present

## 2019-04-30 DIAGNOSIS — S72001D Fracture of unspecified part of neck of right femur, subsequent encounter for closed fracture with routine healing: Secondary | ICD-10-CM | POA: Diagnosis not present

## 2019-04-30 DIAGNOSIS — E785 Hyperlipidemia, unspecified: Secondary | ICD-10-CM | POA: Diagnosis not present

## 2019-04-30 DIAGNOSIS — D473 Essential (hemorrhagic) thrombocythemia: Secondary | ICD-10-CM | POA: Diagnosis not present

## 2019-04-30 DIAGNOSIS — Z8673 Personal history of transient ischemic attack (TIA), and cerebral infarction without residual deficits: Secondary | ICD-10-CM | POA: Diagnosis not present

## 2019-04-30 DIAGNOSIS — Z96641 Presence of right artificial hip joint: Secondary | ICD-10-CM | POA: Diagnosis not present

## 2019-04-30 DIAGNOSIS — I10 Essential (primary) hypertension: Secondary | ICD-10-CM | POA: Diagnosis not present

## 2019-05-02 DIAGNOSIS — S72001D Fracture of unspecified part of neck of right femur, subsequent encounter for closed fracture with routine healing: Secondary | ICD-10-CM | POA: Diagnosis not present

## 2019-05-02 DIAGNOSIS — D473 Essential (hemorrhagic) thrombocythemia: Secondary | ICD-10-CM | POA: Diagnosis not present

## 2019-05-02 DIAGNOSIS — I10 Essential (primary) hypertension: Secondary | ICD-10-CM | POA: Diagnosis not present

## 2019-05-02 DIAGNOSIS — Z8673 Personal history of transient ischemic attack (TIA), and cerebral infarction without residual deficits: Secondary | ICD-10-CM | POA: Diagnosis not present

## 2019-05-02 DIAGNOSIS — E785 Hyperlipidemia, unspecified: Secondary | ICD-10-CM | POA: Diagnosis not present

## 2019-05-02 DIAGNOSIS — Z96641 Presence of right artificial hip joint: Secondary | ICD-10-CM | POA: Diagnosis not present

## 2019-05-10 DIAGNOSIS — D473 Essential (hemorrhagic) thrombocythemia: Secondary | ICD-10-CM | POA: Diagnosis not present

## 2019-05-10 DIAGNOSIS — D649 Anemia, unspecified: Secondary | ICD-10-CM | POA: Diagnosis not present

## 2019-05-25 DIAGNOSIS — S72001A Fracture of unspecified part of neck of right femur, initial encounter for closed fracture: Secondary | ICD-10-CM | POA: Diagnosis not present
